# Patient Record
Sex: Male | Born: 1950 | Race: White | Hispanic: No | Marital: Married | Smoking: Never smoker
Health system: Southern US, Community
[De-identification: ages and names within clinical notes are randomized; demographics above are authoritative.]

## PROBLEM LIST (undated history)

## (undated) DIAGNOSIS — T7840XA Allergy, unspecified, initial encounter: Secondary | ICD-10-CM

## (undated) DIAGNOSIS — N529 Male erectile dysfunction, unspecified: Secondary | ICD-10-CM

## (undated) DIAGNOSIS — K219 Gastro-esophageal reflux disease without esophagitis: Secondary | ICD-10-CM

## (undated) DIAGNOSIS — L301 Dyshidrosis [pompholyx]: Secondary | ICD-10-CM

## (undated) DIAGNOSIS — R079 Chest pain, unspecified: Secondary | ICD-10-CM

## (undated) DIAGNOSIS — J309 Allergic rhinitis, unspecified: Secondary | ICD-10-CM

## (undated) DIAGNOSIS — J45901 Unspecified asthma with (acute) exacerbation: Secondary | ICD-10-CM

## (undated) DIAGNOSIS — S43109A Unspecified dislocation of unspecified acromioclavicular joint, initial encounter: Secondary | ICD-10-CM

## (undated) HISTORY — DX: Chest pain, unspecified: R07.9

## (undated) HISTORY — DX: Male erectile dysfunction, unspecified: N52.9

## (undated) HISTORY — DX: Gastro-esophageal reflux disease without esophagitis: K21.9

## (undated) HISTORY — DX: Dyshidrosis (pompholyx): L30.1

## (undated) HISTORY — DX: Unspecified dislocation of unspecified acromioclavicular joint, initial encounter: S43.109A

## (undated) HISTORY — PX: TONSILLECTOMY AND ADENOIDECTOMY: SUR1326

## (undated) HISTORY — DX: Allergy, unspecified, initial encounter: T78.40XA

## (undated) HISTORY — PX: EYE SURGERY: SHX253

## (undated) HISTORY — DX: Allergic rhinitis, unspecified: J30.9

## (undated) HISTORY — DX: Unspecified asthma with (acute) exacerbation: J45.901

---

## 1999-02-24 ENCOUNTER — Encounter: Payer: Self-pay | Admitting: Emergency Medicine

## 1999-02-24 ENCOUNTER — Emergency Department (HOSPITAL_COMMUNITY): Admission: EM | Admit: 1999-02-24 | Discharge: 1999-02-24 | Payer: Self-pay | Admitting: Emergency Medicine

## 2000-01-07 ENCOUNTER — Encounter: Payer: Self-pay | Admitting: Emergency Medicine

## 2000-01-07 ENCOUNTER — Emergency Department (HOSPITAL_COMMUNITY): Admission: EM | Admit: 2000-01-07 | Discharge: 2000-01-07 | Payer: Self-pay | Admitting: Emergency Medicine

## 2002-05-25 ENCOUNTER — Encounter: Admission: RE | Admit: 2002-05-25 | Discharge: 2002-05-25 | Payer: Self-pay | Admitting: Family Medicine

## 2002-05-25 ENCOUNTER — Encounter: Payer: Self-pay | Admitting: Family Medicine

## 2004-02-26 HISTORY — PX: COLONOSCOPY: SHX174

## 2004-09-13 ENCOUNTER — Ambulatory Visit: Payer: Self-pay | Admitting: Family Medicine

## 2004-10-01 ENCOUNTER — Ambulatory Visit: Payer: Self-pay | Admitting: Family Medicine

## 2004-10-18 ENCOUNTER — Ambulatory Visit: Payer: Self-pay | Admitting: Internal Medicine

## 2004-11-01 ENCOUNTER — Ambulatory Visit: Payer: Self-pay | Admitting: Internal Medicine

## 2005-11-18 ENCOUNTER — Ambulatory Visit (HOSPITAL_COMMUNITY): Admission: RE | Admit: 2005-11-18 | Discharge: 2005-11-18 | Payer: Self-pay | Admitting: Orthopedic Surgery

## 2005-11-18 ENCOUNTER — Emergency Department (HOSPITAL_COMMUNITY): Admission: EM | Admit: 2005-11-18 | Discharge: 2005-11-18 | Payer: Self-pay | Admitting: Family Medicine

## 2006-10-21 DIAGNOSIS — K219 Gastro-esophageal reflux disease without esophagitis: Secondary | ICD-10-CM

## 2006-10-21 HISTORY — DX: Gastro-esophageal reflux disease without esophagitis: K21.9

## 2007-11-30 DIAGNOSIS — S43109A Unspecified dislocation of unspecified acromioclavicular joint, initial encounter: Secondary | ICD-10-CM | POA: Insufficient documentation

## 2007-11-30 HISTORY — DX: Unspecified dislocation of unspecified acromioclavicular joint, initial encounter: S43.109A

## 2007-12-01 ENCOUNTER — Ambulatory Visit: Payer: Self-pay | Admitting: Family Medicine

## 2008-04-22 ENCOUNTER — Ambulatory Visit: Payer: Self-pay | Admitting: Family Medicine

## 2008-04-22 DIAGNOSIS — L301 Dyshidrosis [pompholyx]: Secondary | ICD-10-CM

## 2008-04-22 HISTORY — DX: Dyshidrosis (pompholyx): L30.1

## 2009-04-21 ENCOUNTER — Ambulatory Visit: Payer: Self-pay | Admitting: Family Medicine

## 2009-04-21 LAB — CONVERTED CEMR LAB
ALT: 25 units/L (ref 0–53)
AST: 20 units/L (ref 0–37)
Albumin: 4 g/dL (ref 3.5–5.2)
Alkaline Phosphatase: 57 units/L (ref 39–117)
BUN: 12 mg/dL (ref 6–23)
Basophils Absolute: 0 10*3/uL (ref 0.0–0.1)
Basophils Relative: 0.3 % (ref 0.0–3.0)
Bilirubin, Direct: 0.2 mg/dL (ref 0.0–0.3)
Blood in Urine, dipstick: NEGATIVE
CO2: 28 meq/L (ref 19–32)
Calcium: 9 mg/dL (ref 8.4–10.5)
Chloride: 109 meq/L (ref 96–112)
Cholesterol: 196 mg/dL (ref 0–200)
Creatinine, Ser: 0.9 mg/dL (ref 0.4–1.5)
Eosinophils Absolute: 0.1 10*3/uL (ref 0.0–0.7)
Eosinophils Relative: 3.7 % (ref 0.0–5.0)
GFR calc non Af Amer: 92.36 mL/min (ref >60)
Glucose, Bld: 96 mg/dL (ref 70–99)
Glucose, Urine, Semiquant: NEGATIVE
HCT: 43 % (ref 39.0–52.0)
HDL: 83 mg/dL (ref >39.00)
Hemoglobin: 14.2 g/dL (ref 13.0–17.0)
LDL Cholesterol: 107 mg/dL — ABNORMAL HIGH (ref 0–99)
Lymphocytes Relative: 17.5 % (ref 12.0–46.0)
Lymphs Abs: 0.6 10*3/uL — ABNORMAL LOW (ref 0.7–4.0)
MCHC: 33.1 g/dL (ref 30.0–36.0)
MCV: 98 fL (ref 78.0–100.0)
Monocytes Absolute: 0.4 10*3/uL (ref 0.1–1.0)
Monocytes Relative: 12.3 % — ABNORMAL HIGH (ref 3.0–12.0)
Neutro Abs: 2.2 10*3/uL (ref 1.4–7.7)
Neutrophils Relative %: 66.2 % (ref 43.0–77.0)
Nitrite: NEGATIVE
PSA: 0.9 ng/mL (ref 0.10–4.00)
Platelets: 264 10*3/uL (ref 150.0–400.0)
Potassium: 3.8 meq/L (ref 3.5–5.1)
RBC: 4.39 M/uL (ref 4.22–5.81)
RDW: 12.2 % (ref 11.5–14.6)
Sodium: 141 meq/L (ref 135–145)
Specific Gravity, Urine: 1.025
TSH: 1 microintl units/mL (ref 0.35–5.50)
Total Bilirubin: 0.9 mg/dL (ref 0.3–1.2)
Total CHOL/HDL Ratio: 2
Total Protein: 7 g/dL (ref 6.0–8.3)
Triglycerides: 31 mg/dL (ref 0.0–149.0)
Urobilinogen, UA: 0.2
VLDL: 6.2 mg/dL (ref 0.0–40.0)
WBC Urine, dipstick: NEGATIVE
WBC: 3.3 10*3/uL — ABNORMAL LOW (ref 4.5–10.5)
pH: 5

## 2009-04-27 ENCOUNTER — Ambulatory Visit: Payer: Self-pay | Admitting: Family Medicine

## 2009-04-27 DIAGNOSIS — J309 Allergic rhinitis, unspecified: Secondary | ICD-10-CM

## 2009-04-27 DIAGNOSIS — R079 Chest pain, unspecified: Secondary | ICD-10-CM

## 2009-04-27 DIAGNOSIS — N529 Male erectile dysfunction, unspecified: Secondary | ICD-10-CM

## 2009-04-27 HISTORY — DX: Male erectile dysfunction, unspecified: N52.9

## 2009-04-27 HISTORY — DX: Allergic rhinitis, unspecified: J30.9

## 2009-04-27 HISTORY — DX: Chest pain, unspecified: R07.9

## 2009-05-09 ENCOUNTER — Telehealth: Payer: Self-pay | Admitting: Family Medicine

## 2010-03-02 ENCOUNTER — Ambulatory Visit
Admission: RE | Admit: 2010-03-02 | Discharge: 2010-03-02 | Payer: Self-pay | Source: Home / Self Care | Attending: Internal Medicine | Admitting: Internal Medicine

## 2010-03-02 ENCOUNTER — Telehealth: Payer: Self-pay | Admitting: Family Medicine

## 2010-03-02 DIAGNOSIS — J45901 Unspecified asthma with (acute) exacerbation: Secondary | ICD-10-CM

## 2010-03-02 HISTORY — DX: Unspecified asthma with (acute) exacerbation: J45.901

## 2010-03-27 NOTE — Progress Notes (Signed)
Summary: name of Dentist?  Phone Note Call from Patient   Caller: Patient Call For: [name] MD Summary of Call: Pt needs name and phone number of Dentist that he was talking to Dr. Tawanna Cooler about on his last visit. Thinks he is located on [employer] [phone] Initial call taken by: Lynann Beaver CMA,  May 09, 2009 3:08 PM  Follow-up for Phone Call        Dr. Alvester Morin on [address] Follow-up by: [name] MD,  May 09, 2009 3:46 PM  Additional Follow-up for Phone Call Additional follow up Details #1::        Pt notified. Additional Follow-up by: Lynann Beaver CMA,  May 09, 2009 4:00 PM

## 2010-03-27 NOTE — Assessment & Plan Note (Signed)
Summary: cpx//ccm   Vital Signs:  Patient profile:   60 year old male Height:      70.5 inches Weight:      199 pounds BMI:     28.25 Temp:     98.5 degrees F oral BP sitting:   130 / 62  (left arm) Cuff size:   regular  Vitals Entered By: Kern Reap CMA Duncan Dull) (April 27, 2009 2:00 PM)  Reason for Visit cpx  History of Present Illness: Evan Parker is a 60 year old, married male, nonsmoker comes in for general physical evaluation because of underlying allergic rhinitis, occasional asthma, eczema, and erectile dysfunction.  The allergic rhinitis is treated with plain Zyrtec nightly  Two the occasional wheezing.  He takes Ventolin p.r.n.  He uses Lidex .05% cream p.r.n. for eczema of his hands and feet.  He takes Cialis 20 mg p.r.n. for ED.  He gets routine eye care.  Dental care.  Colonoscopy done, and GI normal.  Tetanus 2002, declines, seasonal flu shot  a new problem is chest pain.  He states he has intermittent chest pain that he describes as a sudden onset of severe sharp pain and he points to the mid sternum as the source of his discomfort.  He states it usually occurs at rest or at night.  He tends the E. late.  He does not smoke.  He does enjoy his crown royal also enjoys an occasional drink of vodka.  He states when he eats and drinks late best when his symptoms occur.  He has no exertional component.  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  With physical labor he has no chest pain.  He has no difficulty swallowing.  Allergies: No Known Drug Allergies  Past History:  Past medical, surgical, family and social histories (including risk factors) reviewed, and no changes noted (except as noted below).  Past Medical History: Reviewed history from 04/22/2008 and no changes required. GERD dyshidrotic eczema  Past Surgical History: Reviewed history from 10/21/2006 and no changes required. T/A FB R eye Colonoscopy  Family History: Reviewed  history from 10/21/2006 and no changes required. Family History Hypertension  Social History: Reviewed history from 10/21/2006 and no changes required. Occupation: Self-employed Married Never Smoked Alcohol use-no Drug use-no  Review of Systems      See HPI  Physical Exam  General:  Well-developed,well-nourished,in no acute distress; alert,appropriate and cooperative throughout examination Head:  Normocephalic and atraumatic without obvious abnormalities. No apparent alopecia or balding. Eyes:  No corneal or conjunctival inflammation noted. EOMI. Perrla. Funduscopic exam benign, without hemorrhages, exudates or papilledema. Vision grossly normal. Ears:  External ear exam shows no significant lesions or deformities.  Otoscopic examination reveals clear canals, tympanic membranes are intact bilaterally without bulging, retraction, inflammation or discharge. Hearing is grossly normal bilaterally. Nose:  External nasal examination shows no deformity or inflammation. Nasal mucosa are pink and moist without lesions or exudates. Mouth:  Oral mucosa and oropharynx without lesions or exudates.  Teeth in good repair. Neck:  No deformities, masses, or tenderness noted. Chest Wall:  No deformities, masses, tenderness or gynecomastia noted. Breasts:  No masses or gynecomastia noted Lungs:  Normal respiratory effort, chest expands symmetrically. Lungs are clear to auscultation, no crackles or wheezes. Heart:  Normal rate and regular rhythm. S1 and S2 normal without gallop, murmur, click, rub or other extra sounds. Abdomen:  Bowel sounds positive,abdomen soft and non-tender without masses, organomegaly or hernias noted. Rectal:  No external abnormalities noted. Normal sphincter tone. No rectal masses or tenderness. Genitalia:  Testes bilaterally descended without nodularity, tenderness or masses. No scrotal masses or lesions. No penis lesions or urethral discharge. Prostate:  Prostate gland firm and  smooth, no enlargement, nodularity, tenderness, mass, asymmetry or induration. Msk:  No deformity or scoliosis noted of thoracic or lumbar spine.   Pulses:  R and L carotid,radial,femoral,dorsalis pedis and posterior tibial pulses are full and equal bilaterally Extremities:  No clubbing, cyanosis, edema, or deformity noted with normal full range of motion of all joints.   Neurologic:  No cranial nerve deficits noted. Station and gait are normal. Plantar reflexes are down-going bilaterally. DTRs are symmetrical throughout. Sensory, motor and coordinative functions appear intact. Skin:  scarring left and right shoulder from previous sun damage.  Otherwise, total skin exam normal Cervical Nodes:  No lymphadenopathy noted Axillary Nodes:  No palpable lymphadenopathy Inguinal Nodes:  No significant adenopathy Psych:  Cognition and judgment appear intact. Alert and cooperative with normal attention span and concentration. 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  Uad  Orders: Prescription Created Electronically [phone])  Complete Medication List: 1)  Zyrtec Allergy 10 Mg Tabs (Cetirizine hcl) 2)  Ventolin Hfa 108 (90 Base) Mcg/act Aers (Albuterol sulfate) .Marland Kitchen.. 1 or 2 ps three times a day as needed 3)  Lidex 0.05 % Crea (Fluocinonide) .... Apply at bedtime 4)  Cialis 20 Mg Tabs (Tadalafil) .... Uad  Patient Instructions: 1)  continue your current medications. 2)  I think the severe, sharp pain, your having is related to esophageal spasm from reflux.  The treatment of which is to avoid alcohol caffeine, and peppermint, and take OTC Prilosec 20 mg before breakfast and 20 mg before evening meal.  If this does not resolve her symptoms or they get worse.  Please call for further evaluation remember nothing to eat or drink for two hours before you go to bed at night and elevate y  head on two pillows 3)  Avoid foods high in acid (tomatoes, citrus juices, spicy foods). Avoid eating within two hours of lying down or before exercising. Do not over eat; try smaller more frequent meals. Elevate head of bed twelve inches when sleeping. Prescriptions: VENTOLIN HFA 108 (90 BASE) MCG/ACT  AERS (ALBUTEROL SULFATE) 1 or 2 ps three times a day as needed  #1 x 1   Entered and Authorized by:   [name] MD   Signed by:   [name] MD on 04/27/2009   Method used:   Electronically to        [facility] (retail)  [address]       [place], [place]  [address]       Ph: [phone]       Fax: [phone]   RxID:   [id number] CIALIS 20 MG TABS (TADALAFIL) UAD  #6 x 11   Entered and Authorized by:   [name] MD   Signed by:   [name] MD on 04/27/2009   Method used:   Electronically to        [facility] (retail)       [address]       [place], [place]  [address]       Ph: [phone]       Fax: [phone]   RxID:   [id number] LIDEX 0.05 % CREA  (FLUOCINONIDE) apply at bedtime  #60 gr x 3   Entered and Authorized by:   [name] MD   Signed by:   [name] MD on 04/27/2009   Method used:   Electronically to        [facility] (retail)       [address]       [place], [place]  [address]       Ph: [phone]       Fax: [phone]   RxID:   [id number]

## 2010-03-29 NOTE — Progress Notes (Signed)
Summary: sinus infection  Phone Note Call from Patient Call back at Home Phone [phone]   Caller: Patient Call For: [name] MD Summary of Call: Pt is calling to see if he can get in to see Dr Tawanna Cooler today.  Has called several times and really does not want to see anyone else. Initial call taken by: Lynann Beaver CMA AAMA,  March 02, 2010 9:49 AM  Follow-up for Phone Call        Fleet Contras please call find out what the issue is............. I would be happy to see him Follow-up by: [name] MD,  March 02, 2010 12:17 PM  Additional Follow-up for Phone Call Additional follow up Details #1::        spoke with patient Additional Follow-up by: Kern Reap CMA Duncan Dull),  March 02, 2010 1:37 PM

## 2010-03-29 NOTE — Assessment & Plan Note (Signed)
Summary: sinus inf/cjr   Vital Signs:  Patient profile:   60 year old male Weight:      189 pounds BMI:     26.83 Temp:     98.4 degrees F oral BP sitting:   130 / 90  (left arm) Cuff size:   regular  Vitals Entered By: Kern Reap CMA Duncan Dull) (March 02, 2010 2:37 PM) CC: head and chest congestion Is Patient Diabetic? No   CC:  head and chest congestion.  History of Present Illness: Evan Parker is a 60 year old, married male, nonsmoker, who has a history of underlying allergic rhinitis, who is around cats and dust.  This past Monday and then the next day Tuesday developed a congestion, postnasal drip, and cough.  Allergies: No Known Drug Allergies  Past History:  Past medical, surgical, family and social histories (including risk factors) reviewed for relevance to current acute and chronic problems.  Past Medical History: Reviewed history from 04/22/2008 and no changes required. GERD dyshidrotic eczema  Past Surgical History: Reviewed history from 10/21/2006 and no changes required. T/A FB R eye Colonoscopy  Family History: Reviewed history from 10/21/2006 and no changes required. Family History Hypertension  Social History: Reviewed history from 10/21/2006 and no changes required. Occupation: Self-employed Married Never Smoked Alcohol use-no Drug use-no  Review of Systems      See HPI  Physical Exam  General:  Well-developed,well-nourished,in no acute distress; alert,appropriate and cooperative throughout examination Head:  Normocephalic and atraumatic without obvious abnormalities. No apparent alopecia or balding. Eyes:  No corneal or conjunctival inflammation noted. EOMI. Perrla. Funduscopic exam benign, without hemorrhages, exudates or papilledema. Vision grossly normal. Ears:  External ear exam shows no significant lesions or deformities.  Otoscopic examination reveals clear canals, tympanic membranes are intact bilaterally without bulging, retraction,  inflammation or discharge. Hearing is grossly normal bilaterally. Nose:  External nasal examination shows no deformity or inflammation. Nasal mucosa are pink and moist without lesions or exudates. Mouth:  Oral mucosa and oropharynx without lesions or exudates.  Teeth in good repair. Neck:  No deformities, masses, or tenderness noted. Chest Wall:  No deformities, masses, tenderness or gynecomastia noted. Lungs:  symmetrical breath sounds, mild late expiratory wheezing   Problems:  Medical Problems Added: 1)  Dx of Asthma, Acute  (YQM-578.46)  Impression & Recommendations:  Problem # 1:  ASTHMA, ACUTE (NGE-952.84) Assessment New  His updated medication list for this problem includes:    Ventolin Hfa 108 (90 Base) Mcg/act Aers (Albuterol sulfate) .Marland Kitchen... 1 or 2 ps three times a day as needed    Prednisone 20 Mg Tabs (Prednisone) ..... Uad  Complete Medication List: 1)  Zyrtec Allergy 10 Mg Tabs (Cetirizine hcl) 2)  Ventolin Hfa 108 (90 Base) Mcg/act Aers (Albuterol sulfate) .Marland Kitchen.. 1 or 2 ps three times a day as needed 3)  Lidex 0.05 % Crea (Fluocinonide) .... Apply at bedtime 4)  Cialis 20 Mg Tabs (Tadalafil) .... Uad 5)  Prednisone 20 Mg Tabs (Prednisone) .... Uad  Patient Instructions: 1)  begin prednisone two tabs now then starting tomorrow morning two tabs every morning x 3 days, one x 3 days, a half x 3 days, then half a tablet Monday, Wednesday, Friday, for a two week taper. 2)  Return p.r.n. Prescriptions: PREDNISONE 20 MG TABS (PREDNISONE) UAD  #40 x 1   Entered and Authorized by:   [name] MD   Signed by:   [name] MD on 03/02/2010   Method used:  Electronically to        [facility]* (retail)       [address] [place], [place]  [address]       Ph: [phone] or [phone]       Fax: [phone]   RxID:   [id number]    Orders Added: 1)  Est. Patient Level IV [34742]

## 2010-04-13 ENCOUNTER — Telehealth: Payer: Self-pay | Admitting: Family Medicine

## 2010-04-13 NOTE — Telephone Encounter (Signed)
Left message for patient to return call.

## 2010-04-13 NOTE — Telephone Encounter (Signed)
Triage vm----wants to know last date of tetanus shot? Has a deep splinter in his finger and is throbbing.

## 2010-04-13 NOTE — Telephone Encounter (Signed)
patient  Will come in Monday or Tuesday in the afternoon for a Tdap. Please schedule and give the patient a call.  thanks

## 2010-07-13 NOTE — Op Note (Signed)
NAME:  Evan Parker, Evan Parker                 ACCOUNT NO.:  [id number]   MEDICAL RECORD NO.:  [id number]          PATIENT TYPE:  AMB   LOCATION:  SDS                          FACILITY:  [facility]   PHYSICIAN:  Artist Pais. Weingold, M.D.DATE OF BIRTH:  01/12/1951   DATE OF PROCEDURE:  11/18/2005  DATE OF DISCHARGE:  11/18/2005                                 OPERATIVE REPORT   PREOPERATIVE DIAGNOSIS:  Imbed foreign material palmar aspect of right hand.   POSTOPERATIVE DIAGNOSIS:  Imbed foreign material palmar aspect of right  hand.   PROCEDURE:  1. Removal of foreign body, deep.  2. Irrigation, debridement and exploration of above.   SURGEON:  Artist Pais. Mina Marble, M.D.   ASSISTANT:  None.   ANESTHESIA:  General.   TOURNIQUET TIME:  Twenty-five minutes.   COMPLICATIONS:  None.   DRAINS:  None.   OPERATIVE REPORT:  The patient was taken to the operating room after the  induction of adequate general  anesthesia.  The right upper extremity was  prepped and draped in the usual sterile fashion.  Esmarch was used to  exsanguinate the limb, inflated to  250 mmHg.  At this point in time a large  a large wooden splinter, which measuring about a centimeter in diameter, 8-  10 cm length was carefully traced into the palmar aspect of the hand.  The  area where the splinter entered the hand was incised obliquely across the  palm for 4 cm until the entire splinter was removed.  Fragments of wood  removed along its path.  Dissection was carried down to the level of the  long ring finger flexor sheath.  The neurovascular bundles  were intact.  The flexor sheaths were intact.  It was irrigated thoroughly with a liter of  normal saline and was closed with 4-0 nylon.  A sterile dressing __________  was applied.  The patient tolerated the procedure well and went to the  recovery room in stable fashion.      Artist Pais Mina Marble, M.D.  Electronically Signed     MAW/MEDQ  D:  11/19/2005  T:  11/20/2005   Job:  [id number]

## 2010-07-13 NOTE — Consult Note (Signed)
NAME:  Evan Parker, Evan Parker                 ACCOUNT NO.:  [id number]   MEDICAL RECORD NO.:  [id number]          PATIENT TYPE:  AMB   LOCATION:  SDS                          FACILITY:  [facility]   PHYSICIAN:  Artist Pais. Mina Marble, M.D.DATE OF BIRTH:  25-Jan-1951   DATE OF CONSULTATION:  DATE OF DISCHARGE:                                   CONSULTATION   REFERRING PHYSICIAN:  Maurice March, M.D.   REASON FOR CONSULTATION:  Evan Parker is a 60 year old right-handed male who  presents today status post injury while working with a table saw with a  large wooden splinter embedded in the palmar aspect of his right hand with  decreased motion of the long and ring finger and intermittent numbness and  tingling.   ALLERGIES:  NO KNOWN DRUG ALLERGIES.   MEDICATIONS:  None.   REASON FOR HOSPITALIZATION:  Surgery.   PAST MEDICAL HISTORY:  Noncontributory.   SOCIAL HISTORY:  Noncontributory.   PHYSICAL EXAMINATION:  GENERAL:  Well-developed, well-nourished male,  pleasant and alert x3.  HAND:  He has an obvious large, wooden splinter, which is about 1 cm in  diameter, 8 cm long, that is piercing the palmar aspect in the area of the  long finger metacarpal phalangeal joint A1 pulley area going toward the  ulnar side.  He has intermittent numbness and tingling in the long ring  finger and loss of motion with pain.  X-rays show soft tissue shattering  only.  No fractures are noted.   ASSESSMENT:  A 60 year old male with a large, embedded, foreign body in the  palmar aspect of his dominant right hand.  He had several attempts in the  [facility], under local anesthesia, to have this removed.  I  recommended taking him to the operating room for removal above, with  expiration as necessary.      Artist Pais Mina Marble, M.D.  Electronically Signed     MAW/MEDQ  D:  11/19/2005  T:  11/20/2005  Job:  [id number]

## 2010-10-08 ENCOUNTER — Other Ambulatory Visit (INDEPENDENT_AMBULATORY_CARE_PROVIDER_SITE_OTHER): Payer: BC Managed Care – PPO

## 2010-10-08 DIAGNOSIS — Z0000 Encounter for general adult medical examination without abnormal findings: Secondary | ICD-10-CM

## 2010-10-08 LAB — LIPID PANEL
Cholesterol: 200 mg/dL (ref 0–200)
HDL: 76.1 mg/dL (ref >39.00)
LDL Cholesterol: 118 mg/dL — ABNORMAL HIGH (ref 0–99)
Total CHOL/HDL Ratio: 3
Triglycerides: 28 mg/dL (ref 0.0–149.0)
VLDL: 5.6 mg/dL (ref 0.0–40.0)

## 2010-10-08 LAB — BASIC METABOLIC PANEL
BUN: 17 mg/dL (ref 6–23)
CO2: 27 mEq/L (ref 19–32)
Calcium: 8.7 mg/dL (ref 8.4–10.5)
Chloride: 104 mEq/L (ref 96–112)
Creatinine, Ser: 0.9 mg/dL (ref 0.4–1.5)
GFR: 87.39 mL/min (ref >60.00)
Glucose, Bld: 109 mg/dL — ABNORMAL HIGH (ref 70–99)
Potassium: 4 mEq/L (ref 3.5–5.1)
Sodium: 139 mEq/L (ref 135–145)

## 2010-10-08 LAB — POCT URINALYSIS DIPSTICK
Ketones, UA: NEGATIVE
Leukocytes, UA: NEGATIVE
Protein, UA: NEGATIVE
Urobilinogen, UA: 0.2

## 2010-10-08 LAB — CBC WITH DIFFERENTIAL/PLATELET
Basophils Absolute: 0 10*3/uL (ref 0.0–0.1)
Basophils Relative: 0.8 % (ref 0.0–3.0)
Eosinophils Absolute: 0.2 10*3/uL (ref 0.0–0.7)
Eosinophils Relative: 4.1 % (ref 0.0–5.0)
HCT: 41 % (ref 39.0–52.0)
Hemoglobin: 14 g/dL (ref 13.0–17.0)
Lymphocytes Relative: 42.7 % (ref 12.0–46.0)
Lymphs Abs: 1.7 10*3/uL (ref 0.7–4.0)
MCHC: 34.3 g/dL (ref 30.0–36.0)
MCV: 96 fl (ref 78.0–100.0)
Monocytes Absolute: 0.3 10*3/uL (ref 0.1–1.0)
Monocytes Relative: 8.2 % (ref 3.0–12.0)
Neutro Abs: 1.8 10*3/uL (ref 1.4–7.7)
Neutrophils Relative %: 44.2 % (ref 43.0–77.0)
Platelets: 269 10*3/uL (ref 150.0–400.0)
RBC: 4.26 Mil/uL (ref 4.22–5.81)
RDW: 13 % (ref 11.5–14.6)
WBC: 4.1 10*3/uL — ABNORMAL LOW (ref 4.5–10.5)

## 2010-10-08 LAB — HEPATIC FUNCTION PANEL
Albumin: 4 g/dL (ref 3.5–5.2)
Alkaline Phosphatase: 55 U/L (ref 39–117)

## 2010-10-12 ENCOUNTER — Encounter: Payer: Self-pay | Admitting: Family Medicine

## 2010-10-15 ENCOUNTER — Encounter: Payer: Self-pay | Admitting: Family Medicine

## 2010-10-15 ENCOUNTER — Ambulatory Visit (INDEPENDENT_AMBULATORY_CARE_PROVIDER_SITE_OTHER): Payer: BC Managed Care – PPO | Admitting: Family Medicine

## 2010-10-15 DIAGNOSIS — J45901 Unspecified asthma with (acute) exacerbation: Secondary | ICD-10-CM

## 2010-10-15 DIAGNOSIS — Z0000 Encounter for general adult medical examination without abnormal findings: Secondary | ICD-10-CM

## 2010-10-15 DIAGNOSIS — J309 Allergic rhinitis, unspecified: Secondary | ICD-10-CM

## 2010-10-15 DIAGNOSIS — Z23 Encounter for immunization: Secondary | ICD-10-CM

## 2010-10-15 DIAGNOSIS — N529 Male erectile dysfunction, unspecified: Secondary | ICD-10-CM

## 2010-10-15 DIAGNOSIS — L301 Dyshidrosis [pompholyx]: Secondary | ICD-10-CM

## 2010-10-15 MED ORDER — FLUOCINONIDE 0.05 % EX CREA: TOPICAL_CREAM | Freq: Two times a day (BID) | CUTANEOUS | Status: DC

## 2010-10-15 MED ORDER — TADALAFIL 20 MG PO TABS: 20.0000 mg | ORAL_TABLET | Freq: Every day | ORAL | Status: DC | PRN

## 2010-10-15 NOTE — Patient Instructions (Signed)
Continue your current medications.  Return in one year or sooner if any problem

## 2010-10-15 NOTE — Progress Notes (Signed)
  Subjective:    Patient ID: Evan Parker, male    DOB: 1950-08-31, 60 y.o.   MRN: [id number]  HPI Evan Parker is a 60 year old, married male, nonsmoker, who comes in today for general physical examination because of history of allergic rhinitis, occasional asthma, eczema, erectile dysfunction.  He's always been in excellent health.  He said no chronic health problems except for above.  He uses Cialis 20 mg p.r.n. For ED and Lidex .05% p.r.n. For eczema.  He also takes over-the-counter Zyrtec 10 mg nightly for allergic rhinitis.  He has an albuterol inhaler home and over this series.  Not had to use it.  He gets routine eye care, dental care, colonoscopy, normal, tetanus booster today.   Review of Systems  Constitutional: Negative.   HENT: Negative.   Eyes: Negative.   Respiratory: Negative.   Cardiovascular: Negative.   Gastrointestinal: Negative.   Genitourinary: Negative.   Musculoskeletal: Negative.   Skin: Negative.   Neurological: Negative.   Hematological: Negative.   Psychiatric/Behavioral: Negative.        Objective:   Physical Exam  Constitutional: He is oriented to person, place, and time. He appears well-developed and well-nourished.  HENT:  Head: Normocephalic and atraumatic.  Right Ear: External ear normal.  Left Ear: External ear normal.  Nose: Nose normal.  Mouth/Throat: Oropharynx is clear and moist.  Eyes: Conjunctivae and EOM are normal. Pupils are equal, round, and reactive to light.  Neck: Normal range of motion. Neck supple. No JVD present. No tracheal deviation present. No thyromegaly present.  Cardiovascular: Normal rate, regular rhythm, normal heart sounds and intact distal pulses.  Exam reveals no gallop and no friction rub.   No murmur heard. Pulmonary/Chest: Effort normal and breath sounds normal. No stridor. No respiratory distress. He has no wheezes. He has no rales. He exhibits no tenderness.  Abdominal: Soft. Bowel sounds are normal. He exhibits no  distension and no mass. There is no tenderness. There is no rebound and no guarding.  Genitourinary: Rectum normal, prostate normal and penis normal. Guaiac negative stool. No penile tenderness.  Musculoskeletal: Normal range of motion. He exhibits no edema and no tenderness.  Lymphadenopathy:    He has no cervical adenopathy.  Neurological: He is alert and oriented to person, place, and time. He has normal reflexes. No cranial nerve deficit. He exhibits normal muscle tone.  Skin: Skin is warm and dry. No rash noted. No erythema. No pallor.       He has slight skin and light hair and light eyes, total body skin exam shows no abnormal.  Lesions  Psychiatric: He has a normal mood and affect. His behavior is normal. Judgment and thought content normal.          Assessment & Plan:  Healthy male.  Allergic rhinitis.  Continue Zyrtec 10 mg nightly  Eczema.  Continue Lidex p.r.n.  Erectile dysfunction continue Cialis 20 mg p.r.n.  Return one year or sooner if any problem

## 2010-12-25 ENCOUNTER — Telehealth: Payer: Self-pay | Admitting: Family Medicine

## 2010-12-25 NOTE — Telephone Encounter (Signed)
Pt cut finger on Saturday 10/27 and needs to know when he had last tetanus shot? Pls call.

## 2010-12-25 NOTE — Telephone Encounter (Signed)
Left message on machine for patient  Tdap given 8/12

## 2012-01-30 ENCOUNTER — Other Ambulatory Visit (INDEPENDENT_AMBULATORY_CARE_PROVIDER_SITE_OTHER): Payer: BC Managed Care – PPO

## 2012-01-30 DIAGNOSIS — Z0000 Encounter for general adult medical examination without abnormal findings: Secondary | ICD-10-CM

## 2012-01-30 LAB — BASIC METABOLIC PANEL
CO2: 29 mEq/L (ref 19–32)
Calcium: 9.2 mg/dL (ref 8.4–10.5)
Chloride: 103 mEq/L (ref 96–112)
Glucose, Bld: 99 mg/dL (ref 70–99)
Sodium: 139 mEq/L (ref 135–145)

## 2012-01-30 LAB — CBC WITH DIFFERENTIAL/PLATELET
Eosinophils Relative: 5.4 % — ABNORMAL HIGH (ref 0.0–5.0)
HCT: 43.3 % (ref 39.0–52.0)
Hemoglobin: 14.7 g/dL (ref 13.0–17.0)
Lymphocytes Relative: 46.4 % — ABNORMAL HIGH (ref 12.0–46.0)
Lymphs Abs: 2.2 10*3/uL (ref 0.7–4.0)
Monocytes Relative: 8.9 % (ref 3.0–12.0)
Platelets: 282 10*3/uL (ref 150.0–400.0)
WBC: 4.7 10*3/uL (ref 4.5–10.5)

## 2012-01-30 LAB — LIPID PANEL
HDL: 70.6 mg/dL (ref >39.00)
Total CHOL/HDL Ratio: 3
Triglycerides: 51 mg/dL (ref 0.0–149.0)
VLDL: 10.2 mg/dL (ref 0.0–40.0)

## 2012-01-30 LAB — POCT URINALYSIS DIPSTICK
Ketones, UA: NEGATIVE
Leukocytes, UA: NEGATIVE
Nitrite, UA: NEGATIVE
Protein, UA: NEGATIVE
Urobilinogen, UA: 0.2
pH, UA: 5.5

## 2012-01-30 LAB — PSA: PSA: 0.94 ng/mL (ref 0.10–4.00)

## 2012-01-30 LAB — HEPATIC FUNCTION PANEL
ALT: 28 U/L (ref 0–53)
Albumin: 3.9 g/dL (ref 3.5–5.2)
Total Protein: 6.9 g/dL (ref 6.0–8.3)

## 2012-01-30 LAB — LDL CHOLESTEROL, DIRECT: Direct LDL: 139.6 mg/dL

## 2012-02-05 ENCOUNTER — Encounter: Payer: Self-pay | Admitting: Family Medicine

## 2012-02-05 ENCOUNTER — Ambulatory Visit (INDEPENDENT_AMBULATORY_CARE_PROVIDER_SITE_OTHER): Payer: BC Managed Care – PPO | Admitting: Family Medicine

## 2012-02-05 VITALS — BP 130/80 | Temp 98.1°F | Ht 71.0 in | Wt 186.0 lb

## 2012-02-05 DIAGNOSIS — Z0000 Encounter for general adult medical examination without abnormal findings: Secondary | ICD-10-CM

## 2012-02-05 DIAGNOSIS — N529 Male erectile dysfunction, unspecified: Secondary | ICD-10-CM

## 2012-02-05 DIAGNOSIS — L301 Dyshidrosis [pompholyx]: Secondary | ICD-10-CM

## 2012-02-05 DIAGNOSIS — J309 Allergic rhinitis, unspecified: Secondary | ICD-10-CM

## 2012-02-05 DIAGNOSIS — Z23 Encounter for immunization: Secondary | ICD-10-CM

## 2012-02-05 NOTE — Progress Notes (Signed)
  Subjective:    Patient ID: Evan Parker, male    DOB: 12-31-1950, 61 y.o.   MRN: [id number]  HPI Evan Parker is a 55-year-old male married nonsmoker who comes in today for general physical examination  He's currently taking clindamycin 150 mg twice a day from his dentist because of a dental infection. 1 about the possibility of C. difficile colitis.  He takes Zyrtec 10 mg daily for allergic rhinitis, steroid cream when necessary for eczema and Cialis 20 mg when necessary for ED. He has an occasional episode of wheezing when he gets a bad cold and he keeps a canister of albuterol home to take when necessary  He gets routine eye care, dental care, colonoscopy normal, tetanus 2012, seasonal flu shot today, information given on shingles  Review of systems negative except he feels fatigued. 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 His sleep is normal weights normal.   Review of Systems  Constitutional: Negative.   HENT: Negative.   Eyes: Negative.   Respiratory: Negative.   Cardiovascular: Negative.   Gastrointestinal: Negative.   Genitourinary: Negative.   Musculoskeletal: Negative.   Skin: Negative.   Neurological: Negative.   Hematological: Negative.   Psychiatric/Behavioral: Negative.        Objective:   Physical Exam  Constitutional: He is oriented to person, place, and time. He appears well-developed and well-nourished.  HENT:  Head: Normocephalic and atraumatic.  Right Ear: External ear normal.  Left Ear: External ear normal.  Nose: Nose normal.  Mouth/Throat: Oropharynx is clear and moist.  Eyes: Conjunctivae normal and EOM are normal. Pupils are equal, round, and reactive to light.  Neck: Normal range of motion. Neck supple. No JVD present. No tracheal deviation present. No thyromegaly present.  Cardiovascular: Normal rate, regular rhythm, normal heart sounds and intact distal pulses.  Exam reveals no gallop and no friction rub.   No murmur  heard. Pulmonary/Chest: Effort normal and breath sounds normal. No stridor. No respiratory distress. He has no wheezes. He has no rales. He exhibits no tenderness.  Abdominal: Soft. Bowel sounds are normal. He exhibits no distension and no mass. There is no tenderness. There is no rebound and no guarding.  Genitourinary: Rectum normal, prostate normal and penis normal. Guaiac negative stool. No penile tenderness.  Musculoskeletal: Normal range of motion. He exhibits no edema and no tenderness.  Lymphadenopathy:    He has no cervical adenopathy.  Neurological: He is alert and oriented to person, place, and time. He has normal reflexes. No cranial nerve deficit. He exhibits normal muscle tone.  Skin: Skin is warm and dry. No rash noted. No erythema. No pallor.  Psychiatric: He has a normal mood and affect. His behavior is normal. Judgment and thought content normal.          Assessment & Plan:  Healthy male  Allergic rhinitis continue Zyrtec 10 mg each bedtime  Eczema Lidex when necessary  Erectile dysfunction Cialis 20 mg when necessary  Occasional asthma secondary to viral syndrome albuterol 1-2 puffs twice a day when necessary

## 2012-02-05 NOTE — Patient Instructions (Signed)
Continue your current medications  Return in one year sooner if any problems 

## 2012-09-06 ENCOUNTER — Encounter (HOSPITAL_COMMUNITY): Payer: Self-pay | Admitting: Family Medicine

## 2012-09-06 ENCOUNTER — Emergency Department (HOSPITAL_COMMUNITY): Payer: BC Managed Care – PPO

## 2012-09-06 ENCOUNTER — Emergency Department (HOSPITAL_COMMUNITY)
Admission: EM | Admit: 2012-09-06 | Discharge: 2012-09-07 | Disposition: A | Discharge status: Home / Self Care | Payer: BC Managed Care – PPO | Attending: Emergency Medicine | Admitting: Emergency Medicine

## 2012-09-06 DIAGNOSIS — R0602 Shortness of breath: Secondary | ICD-10-CM

## 2012-09-06 DIAGNOSIS — R51 Headache: Secondary | ICD-10-CM | POA: Insufficient documentation

## 2012-09-06 DIAGNOSIS — Z79899 Other long term (current) drug therapy: Secondary | ICD-10-CM | POA: Insufficient documentation

## 2012-09-06 DIAGNOSIS — Z8781 Personal history of (healed) traumatic fracture: Secondary | ICD-10-CM | POA: Insufficient documentation

## 2012-09-06 DIAGNOSIS — J45901 Unspecified asthma with (acute) exacerbation: Secondary | ICD-10-CM | POA: Insufficient documentation

## 2012-09-06 DIAGNOSIS — Z8719 Personal history of other diseases of the digestive system: Secondary | ICD-10-CM | POA: Insufficient documentation

## 2012-09-06 DIAGNOSIS — Z87448 Personal history of other diseases of urinary system: Secondary | ICD-10-CM | POA: Insufficient documentation

## 2012-09-06 DIAGNOSIS — R0789 Other chest pain: Secondary | ICD-10-CM | POA: Insufficient documentation

## 2012-09-06 DIAGNOSIS — Z8679 Personal history of other diseases of the circulatory system: Secondary | ICD-10-CM | POA: Insufficient documentation

## 2012-09-06 DIAGNOSIS — R5381 Other malaise: Secondary | ICD-10-CM | POA: Insufficient documentation

## 2012-09-06 DIAGNOSIS — R05 Cough: Secondary | ICD-10-CM | POA: Insufficient documentation

## 2012-09-06 DIAGNOSIS — R059 Cough, unspecified: Secondary | ICD-10-CM | POA: Insufficient documentation

## 2012-09-06 DIAGNOSIS — J4541 Moderate persistent asthma with (acute) exacerbation: Secondary | ICD-10-CM

## 2012-09-06 MED ORDER — LIDOCAINE-PRILOCAINE 2.5-2.5 % EX CREA
TOPICAL_CREAM | Freq: Once | CUTANEOUS | Status: AC
  Administered 2012-09-07: 1 via TOPICAL
  Filled 2012-09-06: qty 5

## 2012-09-06 MED ORDER — ALBUTEROL SULFATE (5 MG/ML) 0.5% IN NEBU
INHALATION_SOLUTION | RESPIRATORY_TRACT | Status: AC
  Administered 2012-09-06: 5 mg
  Filled 2012-09-06: qty 1

## 2012-09-06 MED ORDER — ALBUTEROL SULFATE (5 MG/ML) 0.5% IN NEBU
5.0000 mg | INHALATION_SOLUTION | RESPIRATORY_TRACT | Status: DC
  Administered 2012-09-07: 5 mg via RESPIRATORY_TRACT
  Filled 2012-09-06: qty 1

## 2012-09-06 MED ORDER — IPRATROPIUM BROMIDE 0.02 % IN SOLN
0.5000 mg | RESPIRATORY_TRACT | Status: DC
  Administered 2012-09-07: 0.5 mg via RESPIRATORY_TRACT
  Filled 2012-09-06: qty 2.5

## 2012-09-06 NOTE — ED Notes (Addendum)
Patient states that he has been having shortness of breath and difficulty breathing since yesterday. States that he has recent exposure to methanol race fuel. States having difficulty with taking a deep breath in. Reported shortness of breath with walking and ambulation. Feels like he can't get enough air into his lungs. Reports pain with deep inspiration. Expiratory and Inspiratory wheezing auscultated posterior upper lobes. Diminished in bases.

## 2012-09-06 NOTE — ED Provider Notes (Signed)
History    CSN: [id number] Arrival date & time 09/06/12  2156  First MD Initiated Contact with Patient 09/06/12 2305     Chief Complaint  Patient presents with  . Shortness of Breath   HPI Evan Parker is a very pleasant 62 y.o. male with a long-standing history of allergic rhinitis, environmental allergens, and asthma his entire life who presents with 3 weeks worsening shortness of breath. 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  Also complains of general malaise and headache. Shortness of breath started yesterday after drag-racing, it has persisted today, it has gotten worse, he's had a cough but he has not coughed anything up, occasional phlegm, no hemoptysis, no prior history of venous thromboembolic disease, some chest tightness but no overt chest pain-chest pain and tightness is present there with breathing. No fevers, no chills. No sick contacts. No nausea vomiting or diarrhea.  Patient also works as a Administrator.  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  Past Medical History  Diagnosis Date  . ALLERGIC RHINITIS 04/27/2009  . GERD 10/21/2006  . 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 CHEST PAIN 04/27/2009  . CLOSED DISLOCATION OF ACROMIOCLAVICULAR 11/30/2007  . ASTHMA, ACUTE 03/02/2010   Past Surgical History  Procedure Laterality Date  . Tonsillectomy and adenoidectomy    . Eye surgery      foreign body R eye   Family History  Problem Relation Age of Onset  . Hyperlipidemia Other    History  Substance Use Topics  . Smoking status: Never Smoker   .  Smokeless tobacco: Former Neurosurgeon    Types: Chew  . Alcohol Use: Yes     Comment: 1-2 beers    Review of Systems At least 10pt or greater review of systems completed and are negative except where specified in the HPI.  Allergies  Review of patient's allergies indicates no known allergies.  Home Medications   Current Outpatient Rx  Name  Route  Sig  Dispense  Refill  . albuterol (VENTOLIN HFA) 108 (90 BASE) MCG/ACT inhaler   Inhalation   Inhale 2 puffs into the lungs every 6 (six) hours as needed.           . cetirizine (ZYRTEC) 10 MG tablet   Oral   Take 10 mg by mouth daily.            BP 159/79  Pulse 72  Temp(Src) 98 F (36.7 C) (Oral)  Resp 22  SpO2 93% Physical Exam  Nursing notes reviewed.  Electronic medical record reviewed. VITAL SIGNS:   Filed Vitals:   09/06/12 2201 09/07/12 0017  BP: 159/79   Pulse: 72 68  Temp: 98 F (36.7 C)   TempSrc: Oral   Resp: 22 18  SpO2: 93% 97%   CONSTITUTIONAL: Awake, oriented, appears non-toxic HENT: Atraumatic, normocephalic, oral mucosa pink and moist, airway patent. Nares patent without drainage. External ears normal. EYES: Conjunctiva clear, EOMI, PERRLA NECK: Trachea midline, non-tender, supple CARDIOVASCULAR: Normal heart rate, Normal rhythm, No murmurs, rubs, gallops PULMONARY/CHEST:  Decreased breath sounds bilaterally with bilateral  wheezing and extended expiratory phase. Symmetrical breath sounds. Non-tender. ABDOMINAL: Non-distended, soft, non-tender - no rebound or guarding.  BS normal. NEUROLOGIC: Non-focal, moving all four extremities, no gross sensory or motor deficits. EXTREMITIES: No clubbing, cyanosis, or edema SKIN: Warm, Dry, No erythema, No rash  ED Course  Procedures (including critical care time)  Date: 09/07/2012  Rate: 65  Rhythm: normal sinus rhythm  QRS Axis: normal  Intervals: normal  ST/T Wave abnormalities: Slight J-point elevation in lead V2  Conduction Disutrbances: none   Narrative Interpretation: Small amount of J-point elevation in lead V2, not seen on prior, no significant consecutive ST elevation or depression consistent with acute ischemia or infarction  Labs Reviewed  BLOOD GAS, ARTERIAL - Abnormal; Notable for the following:    Bicarbonate 25.3 (*)    All other components within normal limits  CBC WITH DIFFERENTIAL - Abnormal; Notable for the following:    Eosinophils Relative 7 (*)    All other components within normal limits  BASIC METABOLIC PANEL - Abnormal; Notable for the following:    Potassium 3.4 (*)    Glucose, Bld 143 (*)    All other components within normal limits  CARBOXYHEMOGLOBIN   Dg Chest 2 View  09/07/2012   *RADIOLOGY REPORT*  Clinical Data: Shortness of breath; history of asthma.  CHEST - 2 VIEW  Comparison: None.  Findings: The lungs are well-aerated.  Peribronchial thickening may reflect the patient's asthma.  Mildly increased central lung markings are seen.  There is no evidence of focal opacification, pleural effusion or pneumothorax.  The heart is normal in size; the mediastinal contour is within normal limits.  No acute osseous abnormalities are seen.  IMPRESSION: Nonspecific mildly increased central lung markings noted; peribronchial thickening may reflect the patient's asthma. Findings could reflect a mild infectious process, or could be chronic in nature.   Original Report Authenticated By: Tonia Ghent, M.D.   1. Asthma exacerbation, moderate persistent   2. Shortness of breath     MDM  ODAI WIMMER is a 62 y.o. male presents with SOB - h/o of asthma.  Wheezing and decreased BS B/L - CXR shows what I feel is likely chronic asthma, no infiltrate seen on my or radiologist interpretation.  CO normal - concern with recent exposure to partial combusted methanol.  Pt able to ambulate without significant desaturation.  His albuterol inhaler was also likely out of date.  Suspect patient may need long-acting beta agonist and  inhaled steroid.  He is taking zyrtec - encouraged daily use.  Doubt ACS, no CP.  Pt low risk for PE (Well's 0) especially given clinical context.  He is non-toxic, completing full sentences, in no respiratory distress.  I do not think this patient requires inpatient management for his asthma exacerbation - I do suspect particulate matter plays a role in his asthma exacerbation - pt will delay racing again.  He will follow up with PCP for consideration for other asthmatic management.  DC with albuterol inhaler and spacer with instructions for use from RT.  Prednisone burst for inflammation.  Doubt he needs antibiotics - no evidence for infection.    I explained the diagnosis and have given explicit precautions to return to the ER including worsening SOB, chest pain or any other new or worsening symptoms. The patient and his wife understand and accept the medical plan as it's been dictated and I have answered their questions. Discharge instructions concerning home care and prescriptions have been given including albuterol and  prednisone.  The patient is STABLE and is discharged to home in good condition.    Jones Skene, MD 09/07/12 1205

## 2012-09-07 LAB — CARBOXYHEMOGLOBIN
Carboxyhemoglobin: 1.4 % (ref 0.5–1.5)
Methemoglobin: 1.2 % (ref 0.0–1.5)
O2 Saturation: 95.9 %
Total hemoglobin: 15.5 g/dL (ref 13.5–18.0)

## 2012-09-07 LAB — BLOOD GAS, ARTERIAL
Bicarbonate: 25.3 mEq/L — ABNORMAL HIGH (ref 20.0–24.0)
TCO2: 21.9 mmol/L (ref 0–100)
pCO2 arterial: 41.8 mmHg (ref 35.0–45.0)
pH, Arterial: 7.398 (ref 7.350–7.450)
pO2, Arterial: 81.5 mmHg (ref 80.0–100.0)

## 2012-09-07 LAB — CBC WITH DIFFERENTIAL/PLATELET
Basophils Absolute: 0 10*3/uL (ref 0.0–0.1)
Basophils Relative: 0 % (ref 0–1)
Eosinophils Absolute: 0.6 10*3/uL (ref 0.0–0.7)
Hemoglobin: 15.3 g/dL (ref 13.0–17.0)
MCH: 32.4 pg (ref 26.0–34.0)
MCHC: 35.6 g/dL (ref 30.0–36.0)
Monocytes Relative: 9 % (ref 3–12)
Neutrophils Relative %: 50 % (ref 43–77)
Platelets: 265 10*3/uL (ref 150–400)
RDW: 12.3 % (ref 11.5–15.5)

## 2012-09-07 LAB — BASIC METABOLIC PANEL
BUN: 14 mg/dL (ref 6–23)
GFR calc Af Amer: >90 mL/min (ref >90)
GFR calc non Af Amer: >90 mL/min (ref >90)
Potassium: 3.4 mEq/L — ABNORMAL LOW (ref 3.5–5.1)

## 2012-09-07 MED ORDER — PREDNISONE 20 MG PO TABS: 40.0000 mg | ORAL_TABLET | Freq: Every day | ORAL | Status: DC

## 2012-09-07 MED ORDER — AEROCHAMBER Z-STAT PLUS/MEDIUM MISC: 1.0000 | Freq: Once | Status: DC

## 2012-09-07 MED ORDER — ALBUTEROL SULFATE HFA 108 (90 BASE) MCG/ACT IN AERS
2.0000 | INHALATION_SPRAY | RESPIRATORY_TRACT | Status: DC | PRN
  Administered 2012-09-07: 2 via RESPIRATORY_TRACT
  Filled 2012-09-07 (×2): qty 6.7

## 2012-09-07 MED ORDER — PREDNISONE 20 MG PO TABS
40.0000 mg | ORAL_TABLET | Freq: Once | ORAL | Status: AC
  Administered 2012-09-07: 40 mg via ORAL
  Filled 2012-09-07: qty 2

## 2012-09-07 NOTE — ED Notes (Addendum)
Patient 02 maintained at 94 while walking. Patient denies any SOB while walking

## 2012-12-07 ENCOUNTER — Ambulatory Visit (INDEPENDENT_AMBULATORY_CARE_PROVIDER_SITE_OTHER): Payer: BC Managed Care – PPO | Admitting: Family Medicine

## 2012-12-07 ENCOUNTER — Encounter: Payer: Self-pay | Admitting: Family Medicine

## 2012-12-07 VITALS — BP 120/80 | Temp 98.1°F | Wt 186.0 lb

## 2012-12-07 DIAGNOSIS — J45909 Unspecified asthma, uncomplicated: Secondary | ICD-10-CM | POA: Insufficient documentation

## 2012-12-07 DIAGNOSIS — N529 Male erectile dysfunction, unspecified: Secondary | ICD-10-CM

## 2012-12-07 MED ORDER — TADALAFIL 20 MG PO TABS: 20.0000 mg | ORAL_TABLET | Freq: Every day | ORAL | Status: DC | PRN

## 2012-12-07 MED ORDER — PREDNISONE 20 MG PO TABS: ORAL_TABLET | ORAL | Status: DC

## 2012-12-07 MED ORDER — ALBUTEROL SULFATE HFA 108 (90 BASE) MCG/ACT IN AERS: 2.0000 | INHALATION_SPRAY | Freq: Four times a day (QID) | RESPIRATORY_TRACT | Status: DC | PRN

## 2012-12-07 NOTE — Patient Instructions (Signed)
Take the prednisone as directed  Albuterol 1 puff when necessary  Cialis 20 mg.............. [email or web address]  Purchased a mouth guard at [employer]

## 2012-12-07 NOTE — Progress Notes (Signed)
  Subjective:    Patient ID: Evan Parker, male    DOB: [birth year], 62 y.o.   MRN: [id number]  HPI Evan Parker is a 62 year old male married nonsmoker who comes in today for evaluation of 3 problems  He was recently seen in the hospital July 13 for evaluation of wheezing. He was given an albuterol inhaler to take when necessary. 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 This seems to have exacerbated his asthma. He typically uses one or 2 shots of albuterol weekly  He needs a refill on his Cialis  He's also complaining of pain in the right side of his jaw. 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

## 2012-12-30 ENCOUNTER — Telehealth: Payer: Self-pay | Admitting: Family Medicine

## 2012-12-30 NOTE — Telephone Encounter (Signed)
Pt has injured his bicep muscle. Its very painfull and is begiining to swell. Pt thinks he heard something "pop" in there or tore.  pt will be working in [employer]. Would like to know what to do?

## 2012-12-31 ENCOUNTER — Other Ambulatory Visit: Payer: Self-pay

## 2012-12-31 NOTE — Telephone Encounter (Signed)
Per Dr Tawanna Cooler patient should go to ortho.  Dr Cleophas Dunker

## 2013-01-01 NOTE — Telephone Encounter (Signed)
Pt aware and gave pt [facility] info

## 2013-01-25 HISTORY — PX: SHOULDER SURGERY: SHX246

## 2013-03-09 ENCOUNTER — Telehealth: Payer: Self-pay | Admitting: Family Medicine

## 2013-03-09 NOTE — Telephone Encounter (Signed)
I received a fax from [place] denying Cialis.  Pt must try and fail Viagra.

## 2013-03-10 NOTE — Telephone Encounter (Signed)
I have sent a message to the patient via MyChart

## 2013-03-10 NOTE — Telephone Encounter (Signed)
Spoke with patient and he does not need a prescription at this time

## 2013-05-31 ENCOUNTER — Other Ambulatory Visit (INDEPENDENT_AMBULATORY_CARE_PROVIDER_SITE_OTHER): Payer: BC Managed Care – PPO

## 2013-05-31 DIAGNOSIS — Z0000 Encounter for general adult medical examination without abnormal findings: Secondary | ICD-10-CM

## 2013-05-31 LAB — CBC WITH DIFFERENTIAL/PLATELET
BASOS PCT: 1 % (ref 0.0–3.0)
Basophils Absolute: 0 10*3/uL (ref 0.0–0.1)
EOS ABS: 0.3 10*3/uL (ref 0.0–0.7)
EOS PCT: 5.8 % — AB (ref 0.0–5.0)
HEMATOCRIT: 43.6 % (ref 39.0–52.0)
Hemoglobin: 14.9 g/dL (ref 13.0–17.0)
LYMPHS ABS: 1.9 10*3/uL (ref 0.7–4.0)
Lymphocytes Relative: 39.9 % (ref 12.0–46.0)
MCHC: 34.2 g/dL (ref 30.0–36.0)
MCV: 93.1 fl (ref 78.0–100.0)
MONO ABS: 0.4 10*3/uL (ref 0.1–1.0)
Monocytes Relative: 8.7 % (ref 3.0–12.0)
NEUTROS PCT: 44.6 % (ref 43.0–77.0)
Neutro Abs: 2.1 10*3/uL (ref 1.4–7.7)
Platelets: 287 10*3/uL (ref 150.0–400.0)
RBC: 4.69 Mil/uL (ref 4.22–5.81)
RDW: 13 % (ref 11.5–14.6)
WBC: 4.7 10*3/uL (ref 4.5–10.5)

## 2013-05-31 LAB — HEPATIC FUNCTION PANEL
ALK PHOS: 60 U/L (ref 39–117)
ALT: 25 U/L (ref 0–53)
AST: 21 U/L (ref 0–37)
Albumin: 4.2 g/dL (ref 3.5–5.2)
BILIRUBIN DIRECT: 0.1 mg/dL (ref 0.0–0.3)
TOTAL PROTEIN: 7.1 g/dL (ref 6.0–8.3)
Total Bilirubin: 0.8 mg/dL (ref 0.3–1.2)

## 2013-05-31 LAB — POCT URINALYSIS DIPSTICK
BILIRUBIN UA: NEGATIVE
Blood, UA: NEGATIVE
GLUCOSE UA: NEGATIVE
KETONES UA: NEGATIVE
LEUKOCYTES UA: NEGATIVE
Nitrite, UA: NEGATIVE
PROTEIN UA: NEGATIVE
SPEC GRAV UA: 1.025
Urobilinogen, UA: 0.2
pH, UA: 5.5

## 2013-05-31 LAB — BASIC METABOLIC PANEL
BUN: 15 mg/dL (ref 6–23)
CALCIUM: 9.2 mg/dL (ref 8.4–10.5)
CO2: 29 mEq/L (ref 19–32)
CREATININE: 0.8 mg/dL (ref 0.4–1.5)
Chloride: 105 mEq/L (ref 96–112)
GFR: 98.62 mL/min (ref >60.00)
GLUCOSE: 98 mg/dL (ref 70–99)
POTASSIUM: 4.3 meq/L (ref 3.5–5.1)
Sodium: 139 mEq/L (ref 135–145)

## 2013-05-31 LAB — LIPID PANEL
CHOLESTEROL: 211 mg/dL — AB (ref 0–200)
HDL: 78.3 mg/dL (ref >39.00)
LDL Cholesterol: 127 mg/dL — ABNORMAL HIGH (ref 0–99)
Total CHOL/HDL Ratio: 3
Triglycerides: 31 mg/dL (ref 0.0–149.0)
VLDL: 6.2 mg/dL (ref 0.0–40.0)

## 2013-05-31 LAB — TSH: TSH: 1.44 u[IU]/mL (ref 0.35–5.50)

## 2013-05-31 LAB — PSA: PSA: 0.87 ng/mL (ref 0.10–4.00)

## 2013-06-07 ENCOUNTER — Encounter: Payer: Self-pay | Admitting: Family Medicine

## 2013-06-07 ENCOUNTER — Ambulatory Visit (INDEPENDENT_AMBULATORY_CARE_PROVIDER_SITE_OTHER): Payer: BC Managed Care – PPO | Admitting: Family Medicine

## 2013-06-07 VITALS — BP 120/80 | Temp 98.6°F | Ht 71.0 in | Wt 190.0 lb

## 2013-06-07 DIAGNOSIS — J309 Allergic rhinitis, unspecified: Secondary | ICD-10-CM

## 2013-06-07 DIAGNOSIS — J45909 Unspecified asthma, uncomplicated: Secondary | ICD-10-CM

## 2013-06-07 DIAGNOSIS — H01139 Eczematous dermatitis of unspecified eye, unspecified eyelid: Secondary | ICD-10-CM

## 2013-06-07 DIAGNOSIS — S43109A Unspecified dislocation of unspecified acromioclavicular joint, initial encounter: Secondary | ICD-10-CM

## 2013-06-07 DIAGNOSIS — N529 Male erectile dysfunction, unspecified: Secondary | ICD-10-CM

## 2013-06-07 MED ORDER — ALBUTEROL SULFATE HFA 108 (90 BASE) MCG/ACT IN AERS
2.0000 | INHALATION_SPRAY | Freq: Four times a day (QID) | RESPIRATORY_TRACT | Status: DC | PRN
Start: 2013-06-07 — End: 2016-05-21

## 2013-06-07 MED ORDER — TRIAMCINOLONE ACETONIDE 0.025 % EX OINT: 1.0000 "application " | TOPICAL_OINTMENT | Freq: Two times a day (BID) | CUTANEOUS | Status: DC

## 2013-06-07 MED ORDER — TADALAFIL 20 MG PO TABS: 20.0000 mg | ORAL_TABLET | Freq: Every day | ORAL | Status: DC | PRN

## 2013-06-07 NOTE — Patient Instructions (Signed)
Continue good health habits  Call Dr. Hillis Range for an eye evaluation because of this a dense cataracts  Return in one year for general physical examination sooner if any problems  Dr. Rodena Goldmann

## 2013-06-07 NOTE — Progress Notes (Signed)
Pre visit review using our clinic review tool, if applicable. No additional management support is needed unless otherwise documented below in the visit note. 

## 2013-06-07 NOTE — Progress Notes (Signed)
   Subjective:    Patient ID: Evan Parker, male    DOB: 04/14/1950, 63 y.o.   MRN: [id number]  HPI Evan Parker is a 63 year old male married nonsmoker who comes in today for general physical examination  He is allergic rhinitis for which she takes Zyrtec plain  He uses Cialis 20 mg when necessary for ED  He takes albuterol when necessary for wheezing  His surgery and his right shoulder couple months ago by Dr. Durward Fortes. It went well he cannot lift anything more than 20 pounds going forward.  His last eye check by the optometrist so dense cataracts. He states he's having difficulty seeing at night and has halos around lights and has trouble with distance vision.Marland Kitchen He gets routine dental care followup colonoscopy and GI vaccinations up-to-date   Review of Systems  Constitutional: Negative.   HENT: Negative.   Eyes: Negative.   Respiratory: Negative.   Cardiovascular: Negative.   Gastrointestinal: Negative.   Genitourinary: Negative.   Musculoskeletal: Negative.   Skin: Negative.   Neurological: Negative.   Psychiatric/Behavioral: Negative.        Objective:   Physical Exam  Nursing note and vitals reviewed. Constitutional: He is oriented to person, place, and time. He appears well-developed and well-nourished.  HENT:  Head: Normocephalic and atraumatic.  Right Ear: External ear normal.  Left Ear: External ear normal.  Nose: Nose normal.  Mouth/Throat: Oropharynx is clear and moist.  Eyes: Conjunctivae and EOM are normal. Pupils are equal, round, and reactive to light.  Neck: Normal range of motion. Neck supple. No JVD present. No tracheal deviation present. No thyromegaly present.  Cardiovascular: Normal rate, regular rhythm, normal heart sounds and intact distal pulses.  Exam reveals no gallop and no friction rub.   No murmur heard. Pulmonary/Chest: Effort normal and breath sounds normal. No stridor. No respiratory distress. He has no wheezes. He has no rales. He exhibits no  tenderness.  Abdominal: Soft. Bowel sounds are normal. He exhibits no distension and no mass. There is no tenderness. There is no rebound and no guarding.  Genitourinary: Rectum normal, prostate normal and penis normal. Guaiac negative stool. No penile tenderness.  Musculoskeletal: Normal range of motion. He exhibits no edema and no tenderness.  Lymphadenopathy:    He has no cervical adenopathy.  Neurological: He is alert and oriented to person, place, and time. He has normal reflexes. No cranial nerve deficit. He exhibits normal muscle tone.  Skin: Skin is warm and dry. No rash noted. No erythema. No pallor.  Total body skin exam normal except for cough scar right anterior shoulder from previous surgery  Psychiatric: He has a normal mood and affect. His behavior is normal. Judgment and thought content normal.   Dense bilateral cataracts       Assessment & Plan:  Healthy male  Allergic rhinitis continue Zyrtec  Erectile dysfunction continue Cialis  Occasional asthma albuterol when necessary  Dense bilateral cataracts refer to Dr. Bing Plume

## 2013-11-22 ENCOUNTER — Ambulatory Visit (INDEPENDENT_AMBULATORY_CARE_PROVIDER_SITE_OTHER): Payer: BC Managed Care – PPO | Admitting: Family Medicine

## 2013-11-22 ENCOUNTER — Ambulatory Visit (INDEPENDENT_AMBULATORY_CARE_PROVIDER_SITE_OTHER)
Admission: RE | Admit: 2013-11-22 | Discharge: 2013-11-22 | Disposition: A | Discharge status: Home / Self Care | Payer: BC Managed Care – PPO | Source: Ambulatory Visit | Attending: Family Medicine | Admitting: Family Medicine

## 2013-11-22 ENCOUNTER — Encounter: Payer: Self-pay | Admitting: Family Medicine

## 2013-11-22 VITALS — BP 110/80 | Temp 98.0°F | Wt 184.0 lb

## 2013-11-22 DIAGNOSIS — Z23 Encounter for immunization: Secondary | ICD-10-CM

## 2013-11-22 DIAGNOSIS — M25551 Pain in right hip: Secondary | ICD-10-CM

## 2013-11-22 DIAGNOSIS — M25559 Pain in unspecified hip: Secondary | ICD-10-CM

## 2013-11-22 NOTE — Progress Notes (Signed)
   Subjective:    Patient ID: Evan Parker, male    DOB: 10-01-1950, 63 y.o.   MRN: [id number]  HPI Evan Parker is a 63 year old married male nonsmoker who comes in today with a four-month history of right hip pain without trauma  He's not in the last couple months she's had pain in his right hip. He describes as sometimes sharp sometimes dull. It comes and goes with certain positions were makes it hurt worse. No history of trauma.  His 43 year old son died this summer of a narcotic overdose. He still grieving. Advised to go to counseling   Review of Systems    review of systems negative Objective:   Physical Exam  Well-developed well-nourished male in no acute distress vital signs stable he is afebrile   in the supine position the legs were of equal length except his right leg is a quarter-inch shorter than the left which is not clinically significant. Sensation strength reflexes pulses are within normal limits. External range of motion limited to 30 bilaterally      Assessment & Plan:  Pain right hip......... x-ray........Marland Kitchen begin Motrin 400 mg twice a day 6.

## 2013-11-22 NOTE — Progress Notes (Signed)
Pre visit review using our clinic review tool, if applicable. No additional management support is needed unless otherwise documented below in the visit note. 

## 2013-11-22 NOTE — Patient Instructions (Signed)
Begin Motrin 400 mg twice daily with food  Go to the main office now for your x-ray  We will call you the report

## 2014-09-15 ENCOUNTER — Encounter: Payer: Self-pay | Admitting: Internal Medicine

## 2016-02-14 ENCOUNTER — Telehealth: Payer: Self-pay | Admitting: Family Medicine

## 2016-02-14 NOTE — Telephone Encounter (Signed)
Patient Name: Evan Parker  DOB: 18-May-1950    Initial Comment Caller states he is wanting to go over symptoms mold illness.    Nurse Assessment  Nurse: Thad Ranger RN, Denise Date/Time (Eastern Time): 02/14/2016 2:25:28 PM  Confirm and document reason for call. If symptomatic, describe symptoms. ---Pt has dizziness, chills/hot flashes w/occas night sweats, muscle aches, abd cramping/bloating, HA, sinus congestion. Wants to know if this is r/t mold exposure. Today he is having abd cramping and a severe HA.  Does the patient have any new or worsening symptoms? ---Yes  Will a triage be completed? ---Yes  Related visit to physician within the last 2 weeks? ---No  Does the PT have any chronic conditions? (i.e. diabetes, asthma, etc.) ---No  Is this a behavioral health or substance abuse call? ---No     Guidelines    Guideline Title Affirmed Question Affirmed Notes  Headache [1] Numbness of the face, arm or leg on one side of the body AND [2] new onset    Final Disposition User   Call EMS 911 Now Carmon, RN, Langley Gauss    Comments  Pt refused to call 911 or be seen in ER. States he wants to be seen at the [facility] for testing for mold exposure. States he has not been seen at the [facility] in 2 yrs and was last seen by Dr Sherren Mocha who is his PCP.  Called the [facility] BL w/pt report given. Questioned the pt not being seen at the [facility] in 2 years and was inst that he is still considered a current pt. Operator placed me on hold for the nurse.  Returned call to the [facility] due to lengthly wait time for the office nurse. Per Sharyn Lull, office nurse, authorization received to downgrade pt from 911 to be seen at [facility] within 24 hrs and RN may make the appt for the pt to be seen tomorrow in the [facility].  Advised pt of downgraded from 911 to be seen in [facility] within 24 hours and appt made in the [facility] for 02/15/16 at 1400 with Dr Colin Benton. Pt aware/agreeable.   Referrals  GO TO FACILITY REFUSED   Disagree/Comply: Disagree  Disagree/Comply  Reason: Disagree with instructions

## 2016-02-14 NOTE — Telephone Encounter (Signed)
Noted  

## 2016-02-15 ENCOUNTER — Encounter: Payer: Self-pay | Admitting: Family Medicine

## 2016-02-15 ENCOUNTER — Ambulatory Visit (INDEPENDENT_AMBULATORY_CARE_PROVIDER_SITE_OTHER): Payer: PRIVATE HEALTH INSURANCE | Admitting: Family Medicine

## 2016-02-15 VITALS — BP 120/88 | HR 51 | Temp 97.8°F | Ht 71.0 in | Wt 189.9 lb

## 2016-02-15 DIAGNOSIS — J989 Respiratory disorder, unspecified: Secondary | ICD-10-CM

## 2016-02-15 NOTE — Progress Notes (Signed)
Pre visit review using our clinic review tool, if applicable. No additional management support is needed unless otherwise documented below in the visit note. 

## 2016-02-15 NOTE — Progress Notes (Signed)
HPI:  Evan Parker is a pleasant 65 year old here for an acute visit for flu like symptoms. He reports that 3 days ago, right after cleaning some mold off miniblinds he developed a runny nose, congestion, cough, nausea, mild dizziness, headache and sore neck and back muscles. He thinks he may have had a mild fever. Most of these symptoms have now resolved. Currently he has no shortness of breath, thick mucus production, fevers, dizziness, wheezing or any significant symptoms other than some mild nasal congestion which she says is chronic with his allergies. He does have a history of asthma, but is not having any asthma symptoms at this time. He wonders if this was due to the mold and is inquiring about a blood tests for mold.  ROS: See pertinent positives and negatives per HPI.  Past Medical History:  Diagnosis Date  . ALLERGIC RHINITIS 04/27/2009  . ASTHMA, ACUTE 03/02/2010  . CHEST PAIN 04/27/2009  . CLOSED DISLOCATION OF ACROMIOCLAVICULAR 11/30/2007  . 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 GERD 10/21/2006    Past Surgical History:  Procedure Laterality Date  . EYE SURGERY     foreign body R eye  . TONSILLECTOMY AND ADENOIDECTOMY      Family History  Problem Relation Age of Onset  . Hyperlipidemia Other     Social History   Social History  . Marital status: Married    Spouse name: N/A  . Number of children: N/A  . Years of education: N/A   Social History Main Topics  . Smoking status: Never Smoker  . Smokeless tobacco: Former Systems developer    Types: Chew  . Alcohol use Yes     Comment: 1-2 beers  . Drug use: Unknown  . Sexual activity: Yes   Other Topics Concern  . None   Social History Narrative  . None     Current Outpatient Prescriptions:  .  albuterol (VENTOLIN HFA) 108 (90 BASE) MCG/ACT inhaler, Inhale 2 puffs into the lungs every 6 (six) hours as needed., Disp: 1 Inhaler, Rfl: 2 .  cetirizine (ZYRTEC) 10 MG tablet, Take 10 mg by  mouth daily.  , Disp: , Rfl:  .  tadalafil (CIALIS) 20 MG tablet, Take 1 tablet (20 mg total) by mouth daily as needed., Disp: 10 tablet, Rfl: 11 .  triamcinolone (KENALOG) 0.025 % ointment, Apply 1 application topically 2 (two) times daily., Disp: 30 g, Rfl: 2 .  triamcinolone cream (KENALOG) 0.1 %, Apply 1 application topically 2 (two) times daily., Disp: , Rfl:   EXAM:  Vitals:   02/15/16 1408  BP: 120/88  Pulse: (!) 51  Temp: 97.8 F (36.6 C)    Body mass index is 26.49 kg/m.  GENERAL: vitals reviewed and listed above, alert, oriented, appears well hydrated and in no acute distress  HEENT: atraumatic, conjunttiva clear, no obvious abnormalities on inspection of external nose and ears, normal appearance of ear canals and TMs, clear nasal congestion, mild post oropharyngeal erythema with PND, no tonsillar edema or exudate, no sinus TTP  NECK: no obvious masses on inspection  LUNGS: clear to auscultation bilaterally, no wheezes, rales or rhonchi, good air movement  CV: HRRR, no peripheral edema  MS: moves all extremities without noticeable abnormality  PSYCH: pleasant and cooperative, no obvious depression or anxiety  ASSESSMENT AND PLAN:  Discussed the following assessment and plan:  Respiratory illness  -we discussed possible serious and likely etiologies, workup and treatment, treatment risks and return precautions -  most likely viral infection or even mild flu; unlikely mold related -Regardless, it seems he has almost fully recovered other than some mild nasal congestion -after this discussion, [name] opted for observation, symptomatic treatment -follow up advised to 4 weeks with his regular primary care doctor given he has not had a regular follow-up visit in some time and given he is still quite concerned about the mold -of course, we advised [name]  to return or notify a doctor immediately if symptoms worsen or persist or new concerns arise.   Patient Instructions   Follow up with Dr. Sherren Mocha in 2-4 weeks.     Colin Benton R., DO

## 2016-02-15 NOTE — Patient Instructions (Signed)
Follow up with Dr. Sherren Mocha in 2-4 weeks.

## 2016-05-14 ENCOUNTER — Other Ambulatory Visit (INDEPENDENT_AMBULATORY_CARE_PROVIDER_SITE_OTHER): Payer: PRIVATE HEALTH INSURANCE

## 2016-05-14 DIAGNOSIS — Z0000 Encounter for general adult medical examination without abnormal findings: Secondary | ICD-10-CM | POA: Diagnosis not present

## 2016-05-14 LAB — LIPID PANEL
CHOLESTEROL: 213 mg/dL — AB (ref 0–200)
HDL: 79.5 mg/dL (ref >39.00)
LDL CALC: 125 mg/dL — AB (ref 0–99)
NonHDL: 133.32
TRIGLYCERIDES: 41 mg/dL (ref 0.0–149.0)
Total CHOL/HDL Ratio: 3
VLDL: 8.2 mg/dL (ref 0.0–40.0)

## 2016-05-14 LAB — CBC WITH DIFFERENTIAL/PLATELET
BASOS PCT: 0.7 % (ref 0.0–3.0)
Basophils Absolute: 0 10*3/uL (ref 0.0–0.1)
Eosinophils Absolute: 0.2 10*3/uL (ref 0.0–0.7)
Eosinophils Relative: 3.8 % (ref 0.0–5.0)
HCT: 42.8 % (ref 39.0–52.0)
Hemoglobin: 14.9 g/dL (ref 13.0–17.0)
LYMPHS ABS: 2 10*3/uL (ref 0.7–4.0)
Lymphocytes Relative: 41.3 % (ref 12.0–46.0)
MCHC: 34.7 g/dL (ref 30.0–36.0)
MCV: 93.9 fl (ref 78.0–100.0)
MONO ABS: 0.4 10*3/uL (ref 0.1–1.0)
MONOS PCT: 8.5 % (ref 3.0–12.0)
NEUTROS ABS: 2.3 10*3/uL (ref 1.4–7.7)
NEUTROS PCT: 45.7 % (ref 43.0–77.0)
Platelets: 285 10*3/uL (ref 150.0–400.0)
RBC: 4.56 Mil/uL (ref 4.22–5.81)
RDW: 12.9 % (ref 11.5–15.5)
WBC: 4.9 10*3/uL (ref 4.0–10.5)

## 2016-05-14 LAB — POC URINALSYSI DIPSTICK (AUTOMATED)
BILIRUBIN UA: NEGATIVE
Blood, UA: NEGATIVE
GLUCOSE UA: NEGATIVE
Ketones, UA: NEGATIVE
LEUKOCYTES UA: NEGATIVE
Nitrite, UA: NEGATIVE
PH UA: 6 (ref 5.0–8.0)
Protein, UA: NEGATIVE
Spec Grav, UA: 1.03 (ref 1.030–1.035)
Urobilinogen, UA: 0.2 (ref ?–2.0)

## 2016-05-14 LAB — HEPATIC FUNCTION PANEL
ALBUMIN: 4.3 g/dL (ref 3.5–5.2)
ALT: 22 U/L (ref 0–53)
AST: 17 U/L (ref 0–37)
Alkaline Phosphatase: 63 U/L (ref 39–117)
BILIRUBIN TOTAL: 1 mg/dL (ref 0.2–1.2)
Bilirubin, Direct: 0.2 mg/dL (ref 0.0–0.3)
Total Protein: 6.9 g/dL (ref 6.0–8.3)

## 2016-05-14 LAB — BASIC METABOLIC PANEL
BUN: 15 mg/dL (ref 6–23)
CHLORIDE: 105 meq/L (ref 96–112)
CO2: 28 meq/L (ref 19–32)
Calcium: 9.5 mg/dL (ref 8.4–10.5)
Creatinine, Ser: 0.94 mg/dL (ref 0.40–1.50)
GFR: 85.79 mL/min (ref >60.00)
GLUCOSE: 103 mg/dL — AB (ref 70–99)
Potassium: 4.1 mEq/L (ref 3.5–5.1)
SODIUM: 141 meq/L (ref 135–145)

## 2016-05-14 LAB — PSA: PSA: 0.68 ng/mL (ref 0.10–4.00)

## 2016-05-14 LAB — TSH: TSH: 2.09 u[IU]/mL (ref 0.35–4.50)

## 2016-05-21 ENCOUNTER — Encounter: Payer: Self-pay | Admitting: Family Medicine

## 2016-05-21 ENCOUNTER — Ambulatory Visit (INDEPENDENT_AMBULATORY_CARE_PROVIDER_SITE_OTHER): Payer: PRIVATE HEALTH INSURANCE | Admitting: Family Medicine

## 2016-05-21 VITALS — BP 130/84 | Temp 97.6°F | Ht 70.25 in | Wt 190.0 lb

## 2016-05-21 DIAGNOSIS — H900 Conductive hearing loss, bilateral: Secondary | ICD-10-CM

## 2016-05-21 DIAGNOSIS — N529 Male erectile dysfunction, unspecified: Secondary | ICD-10-CM | POA: Diagnosis not present

## 2016-05-21 DIAGNOSIS — J309 Allergic rhinitis, unspecified: Secondary | ICD-10-CM | POA: Diagnosis not present

## 2016-05-21 DIAGNOSIS — L301 Dyshidrosis [pompholyx]: Secondary | ICD-10-CM | POA: Diagnosis not present

## 2016-05-21 DIAGNOSIS — H01139 Eczematous dermatitis of unspecified eye, unspecified eyelid: Secondary | ICD-10-CM | POA: Diagnosis not present

## 2016-05-21 DIAGNOSIS — J4520 Mild intermittent asthma, uncomplicated: Secondary | ICD-10-CM

## 2016-05-21 DIAGNOSIS — Z136 Encounter for screening for cardiovascular disorders: Secondary | ICD-10-CM

## 2016-05-21 DIAGNOSIS — Z0000 Encounter for general adult medical examination without abnormal findings: Secondary | ICD-10-CM | POA: Diagnosis not present

## 2016-05-21 MED ORDER — SILDENAFIL CITRATE 20 MG PO TABS: ORAL_TABLET | ORAL | 11 refills | Status: DC

## 2016-05-21 MED ORDER — TADALAFIL 20 MG PO TABS: 20.0000 mg | ORAL_TABLET | Freq: Every day | ORAL | 11 refills | Status: DC | PRN

## 2016-05-21 MED ORDER — TRIAMCINOLONE ACETONIDE 0.025 % EX OINT: 1.0000 "application " | TOPICAL_OINTMENT | Freq: Two times a day (BID) | CUTANEOUS | 5 refills | Status: DC

## 2016-05-21 MED ORDER — ALBUTEROL SULFATE HFA 108 (90 BASE) MCG/ACT IN AERS: 2.0000 | INHALATION_SPRAY | Freq: Four times a day (QID) | RESPIRATORY_TRACT | 2 refills | Status: DC | PRN

## 2016-05-21 NOTE — Patient Instructions (Signed)
When you shower use warm water not hot and after you shower apply Lubriderm to your back  Call Dr. Audry Pili ophthalmologist for eye exam........ you need your cataracts removed  Dr. Gloriann Loan DDS  Nadene Rubins........... audiologist at [facility]  Return in one year for general physical exam sooner if any problems

## 2016-05-21 NOTE — Progress Notes (Signed)
Evan Parker is a 66 year old married male nonsmoker..... Self-employed does [employer]...Marland KitchenMarland KitchenMarland Kitchen who comes in today for general physical examination because history of allergic rhinitis, occasional asthma, erectile dysfunction and hearing loss  He uses albuterol as needed maybe once or twice a month when he has a flare of his asthma. 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 The Desyrel sometimes triggered some wheezing. He also takes Zyrtec every day  He uses Cialis 20 mg when necessary for ED  Uses Kenalog 0.1 twice a day for eczema  He has severe itching and dry skin. He also has a new lesion on his right forearm that won't heal. He is light skin and light eyes.  His vision is deteriorated point he can't see to drive at night. He did see Dr. Bing Plume at one point in time however he would like to see another ophthalmologist.  His hearing is gotten really bad. 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  He saw Simona Huh but didn't like the hygienist. Refer to Dr. Gloriann Loan  Last colonoscopy 06 was normal. Will refer him to GI for follow-up colonoscopy.  Immunizations tetanus 2012 declines a flu shot information given on shingles.  14 point review of systems reviewed and otherwise negative  BP 130/84 (BP Location: Left Arm, Patient Position: Sitting, Cuff Size: Normal)   Temp 97.6 F (36.4 C) (Oral)   Ht 5' 10.25" (1.784 m)   Wt 190 lb (86.2 kg)   BMI 27.07 kg/m  Examination HEENT was negative except for extremely dense cataracts unable to visualize his retina. Neck was supple thyroid not enlarged no carotid bruits. Cardiopulmonary exam normal abdominal exam normal genitalia normal circumcised male rectum normal stool guaiac-negative prostate normal extremities normal skin no peripheral pulses normal except for lesion right forearm that appears to be an inflamed actinic keratosis. Advised to come back to have it removed.  #1 allergic rhinitis........ continue  daily Zyrtec  #2 mild intermittent asthma....... albuterol when necessary  #3 severe hearing loss...Marland KitchenMarland KitchenMarland Kitchen referred to [facility]ologist at [facility]  #4 dry skin....... outlined dermatologic program  #5 abnormal lesion right forearm...Marland KitchenMarland KitchenMarland Kitchen return for removal  #6 family history of prostate cancer..... Father......Marland Kitchen recommend annual screening and DRE  #7 erectile dysfunction...Marland KitchenMarland KitchenMarland Kitchen refill Cialis

## 2016-05-21 NOTE — Progress Notes (Signed)
Pre visit review using our clinic review tool, if applicable. No additional management support is needed unless otherwise documented below in the visit note. 

## 2016-06-05 ENCOUNTER — Encounter: Payer: Self-pay | Admitting: Family Medicine

## 2016-06-05 ENCOUNTER — Ambulatory Visit (INDEPENDENT_AMBULATORY_CARE_PROVIDER_SITE_OTHER): Payer: PRIVATE HEALTH INSURANCE | Admitting: Family Medicine

## 2016-06-05 VITALS — BP 138/82 | Temp 98.4°F | Wt 191.0 lb

## 2016-06-05 DIAGNOSIS — L570 Actinic keratosis: Secondary | ICD-10-CM | POA: Insufficient documentation

## 2016-06-05 DIAGNOSIS — D0462 Carcinoma in situ of skin of left upper limb, including shoulder: Secondary | ICD-10-CM | POA: Diagnosis not present

## 2016-06-05 NOTE — Progress Notes (Signed)
Pre visit review using our clinic review tool, if applicable. No additional management support is needed unless otherwise documented below in the visit note. 

## 2016-06-05 NOTE — Patient Instructions (Signed)
We will call you within 2 weeks with the path report......... if we do not call you in 3 weeks call us

## 2016-06-05 NOTE — Progress Notes (Signed)
Raffield a 66 year old married male nonsmoker comes in today for follow-up of his skin  Visit light skin and blue eyes is had a lot of sun damage. He works outdoors.  He had a lesion on his right forearm that has spontaneously resolved however he's got a lesion on his left forearm is been there for about 2 years it's increasing in size.  #1 8 mm x 8 mm lesion left forearm red ring with a central crusting consistent with a possible early skin cancer  BP 138/82 (BP Location: Left Arm, Patient Position: Sitting, Cuff Size: Normal)   Temp 98.4 F (36.9 C) (Oral)   Wt 191 lb (86.6 kg)   BMI 27.21 kg/m  After informed consent the patient was taken to treatment room. The lesion was cleaned with alcohol and anesthetized with 1% Xylocaine. It was excised with 2 mm margins. Base was cauterized Band-Aid was applied. The lesion was sent for pathologic analysis  #1 8 mm x 8 mm red crusty lesion left forearm.........Marland Kitchen removed sent for pathology rule out skin cancer clinically it appears to be an irritated AK which is a premalignant lesion,,,,,

## 2016-06-05 NOTE — Addendum Note (Signed)
Addended by: Miles Costain T on: 06/05/2016 01:21 PM   Modules accepted: Orders

## 2016-11-04 ENCOUNTER — Ambulatory Visit (INDEPENDENT_AMBULATORY_CARE_PROVIDER_SITE_OTHER): Payer: PRIVATE HEALTH INSURANCE | Admitting: Family Medicine

## 2016-11-04 ENCOUNTER — Encounter: Payer: Self-pay | Admitting: Family Medicine

## 2016-11-04 VITALS — BP 140/82 | HR 62 | Temp 98.1°F | Wt 187.0 lb

## 2016-11-04 DIAGNOSIS — D049 Carcinoma in situ of skin, unspecified: Secondary | ICD-10-CM

## 2016-11-04 NOTE — Patient Instructions (Signed)
Return in 6 months for annual checkups sooner if any problems

## 2016-11-04 NOTE — Progress Notes (Signed)
Evan Parker is a 66 year old male comes in today for follow-up of an abnormal skin lesion  We saw him last spring with a seborrheic keratosis on his left forearm however it had a central pit that did not look normal. The lesion was excised in toto under local anesthesia with 3 mm margins. It can back seborrheic keratosis with a central squamous cell carcinoma that did not extend to the margins. He comes in today for follow-up.  He says he sees no recurrence  BP 140/82 (BP Location: Left Arm, Patient Position: Sitting, Cuff Size: Normal)   Pulse 62   Temp 98.1 F (36.7 C) (Oral)   Wt 187 lb (84.8 kg)   BMI 26.64 kg/m  Examination skin shows a scar from previous surgery in spring 2018 as noted above. No evidence of recurrence.  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 Follow-up in 6 months sooner when necessary

## 2016-11-15 ENCOUNTER — Encounter: Payer: Self-pay | Admitting: Family Medicine

## 2016-12-16 ENCOUNTER — Ambulatory Visit: Payer: PRIVATE HEALTH INSURANCE

## 2017-05-21 ENCOUNTER — Ambulatory Visit (INDEPENDENT_AMBULATORY_CARE_PROVIDER_SITE_OTHER): Payer: Medicare Other | Admitting: Family Medicine

## 2017-05-21 ENCOUNTER — Encounter: Payer: Self-pay | Admitting: Family Medicine

## 2017-05-21 ENCOUNTER — Encounter: Payer: Self-pay | Admitting: Internal Medicine

## 2017-05-21 VITALS — BP 138/82 | HR 52 | Temp 97.5°F | Ht 70.0 in | Wt 182.0 lb

## 2017-05-21 DIAGNOSIS — N529 Male erectile dysfunction, unspecified: Secondary | ICD-10-CM | POA: Diagnosis not present

## 2017-05-21 DIAGNOSIS — N401 Enlarged prostate with lower urinary tract symptoms: Secondary | ICD-10-CM

## 2017-05-21 DIAGNOSIS — J309 Allergic rhinitis, unspecified: Secondary | ICD-10-CM | POA: Diagnosis not present

## 2017-05-21 DIAGNOSIS — Z9842 Cataract extraction status, left eye: Secondary | ICD-10-CM

## 2017-05-21 DIAGNOSIS — R351 Nocturia: Secondary | ICD-10-CM | POA: Diagnosis not present

## 2017-05-21 DIAGNOSIS — Z0000 Encounter for general adult medical examination without abnormal findings: Secondary | ICD-10-CM

## 2017-05-21 DIAGNOSIS — J4520 Mild intermittent asthma, uncomplicated: Secondary | ICD-10-CM

## 2017-05-21 DIAGNOSIS — H01139 Eczematous dermatitis of unspecified eye, unspecified eyelid: Secondary | ICD-10-CM | POA: Diagnosis not present

## 2017-05-21 DIAGNOSIS — M25551 Pain in right hip: Secondary | ICD-10-CM

## 2017-05-21 DIAGNOSIS — L301 Dyshidrosis [pompholyx]: Secondary | ICD-10-CM

## 2017-05-21 DIAGNOSIS — L309 Dermatitis, unspecified: Secondary | ICD-10-CM

## 2017-05-21 DIAGNOSIS — Z136 Encounter for screening for cardiovascular disorders: Secondary | ICD-10-CM | POA: Diagnosis not present

## 2017-05-21 DIAGNOSIS — H9190 Unspecified hearing loss, unspecified ear: Secondary | ICD-10-CM

## 2017-05-21 DIAGNOSIS — Z9841 Cataract extraction status, right eye: Secondary | ICD-10-CM

## 2017-05-21 LAB — CBC WITH DIFFERENTIAL/PLATELET
BASOS ABS: 0 10*3/uL (ref 0.0–0.1)
BASOS PCT: 0.9 % (ref 0.0–3.0)
EOS PCT: 3.7 % (ref 0.0–5.0)
Eosinophils Absolute: 0.2 10*3/uL (ref 0.0–0.7)
HEMATOCRIT: 44 % (ref 39.0–52.0)
Hemoglobin: 15.3 g/dL (ref 13.0–17.0)
Lymphocytes Relative: 42.4 % (ref 12.0–46.0)
Lymphs Abs: 2 10*3/uL (ref 0.7–4.0)
MCHC: 34.7 g/dL (ref 30.0–36.0)
MCV: 94.6 fl (ref 78.0–100.0)
MONOS PCT: 7.8 % (ref 3.0–12.0)
Monocytes Absolute: 0.4 10*3/uL (ref 0.1–1.0)
NEUTROS ABS: 2.2 10*3/uL (ref 1.4–7.7)
NEUTROS PCT: 45.2 % (ref 43.0–77.0)
PLATELETS: 303 10*3/uL (ref 150.0–400.0)
RBC: 4.65 Mil/uL (ref 4.22–5.81)
RDW: 13.7 % (ref 11.5–15.5)
WBC: 4.8 10*3/uL (ref 4.0–10.5)

## 2017-05-21 LAB — PSA: PSA: 0.84 ng/mL (ref 0.10–4.00)

## 2017-05-21 LAB — BASIC METABOLIC PANEL
BUN: 15 mg/dL (ref 6–23)
CHLORIDE: 101 meq/L (ref 96–112)
CO2: 31 meq/L (ref 19–32)
Calcium: 9.4 mg/dL (ref 8.4–10.5)
Creatinine, Ser: 0.81 mg/dL (ref 0.40–1.50)
GFR: 101.54 mL/min (ref >60.00)
Glucose, Bld: 94 mg/dL (ref 70–99)
POTASSIUM: 4.5 meq/L (ref 3.5–5.1)
Sodium: 139 mEq/L (ref 135–145)

## 2017-05-21 LAB — LIPID PANEL
CHOL/HDL RATIO: 2
Cholesterol: 207 mg/dL — ABNORMAL HIGH (ref 0–200)
HDL: 84.3 mg/dL (ref >39.00)
LDL CALC: 114 mg/dL — AB (ref 0–99)
NonHDL: 122.76
TRIGLYCERIDES: 42 mg/dL (ref 0.0–149.0)
VLDL: 8.4 mg/dL (ref 0.0–40.0)

## 2017-05-21 LAB — POCT URINALYSIS DIPSTICK
Bilirubin, UA: NEGATIVE
Blood, UA: NEGATIVE
GLUCOSE UA: NEGATIVE
KETONES UA: NEGATIVE
Leukocytes, UA: NEGATIVE
NITRITE UA: NEGATIVE
PROTEIN UA: NEGATIVE
Spec Grav, UA: 1.02 (ref 1.010–1.025)
Urobilinogen, UA: 0.2 E.U./dL
pH, UA: 6 (ref 5.0–8.0)

## 2017-05-21 LAB — HEPATIC FUNCTION PANEL
ALBUMIN: 4.2 g/dL (ref 3.5–5.2)
ALT: 21 U/L (ref 0–53)
AST: 19 U/L (ref 0–37)
Alkaline Phosphatase: 56 U/L (ref 39–117)
Bilirubin, Direct: 0.1 mg/dL (ref 0.0–0.3)
TOTAL PROTEIN: 7.2 g/dL (ref 6.0–8.3)
Total Bilirubin: 0.7 mg/dL (ref 0.2–1.2)

## 2017-05-21 LAB — TSH: TSH: 2.06 u[IU]/mL (ref 0.35–4.50)

## 2017-05-21 MED ORDER — TRIAMCINOLONE ACETONIDE 0.025 % EX OINT: 1.0000 "application " | TOPICAL_OINTMENT | Freq: Two times a day (BID) | CUTANEOUS | 5 refills | Status: DC

## 2017-05-21 MED ORDER — ALBUTEROL SULFATE HFA 108 (90 BASE) MCG/ACT IN AERS: 2.0000 | INHALATION_SPRAY | Freq: Four times a day (QID) | RESPIRATORY_TRACT | 2 refills | Status: AC | PRN

## 2017-05-21 MED ORDER — SILDENAFIL CITRATE 20 MG PO TABS: ORAL_TABLET | ORAL | 11 refills | Status: DC

## 2017-05-21 NOTE — Patient Instructions (Signed)
Labs today........ I will call you with any abnormalities  Go to the main office on [facility].......Marland Kitchen X-ray of your hips....... I'll call you the report  Consider an audiogram at [facility],,,,,,,,, hearing protection!!!!!!!!!!!!!!!!!  Generic Viagra...........Marland Kitchen 1 or 2 tabs 2-3 hours prior to sex  Motrin 400 mg twice a day for hip pain  For the eczema remember to use the triamcinolone with Eucerin or Lubriderm........... Small amounts twice daily until clear

## 2017-05-21 NOTE — Progress Notes (Signed)
Evan Parker is a 67 year old married male nonsmoker....... Owns his own [employer].... Who comes in today for evaluation for the following problems  Over the past year he's had more difficulty with pain in his right hip area points the right groin as a source of his pain. When he walks slowly it doesn't bother him. When he walks fast he developed severe pain and has to stop.  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  He had bilateral cataracts and lens implants by Dr. Gershon Crane last fall.  He has a history of allergic rhinitis which he takes over-the-counter Zyrtec. He also has an occasional flare of asthma when he gets a bad cold and uses albuterol. Last year use the albuterol for couple days when he had a bad cold.  He uses generic Viagra for ED  He gets routine eye care, dental care, colonoscopy last 2006. Does not recall getting a recall card. Also note to GI.  Vaccinations.... Do a Prevnar 13 which we will given today. Information given on shingles vaccine. Advised to get an annual flu shot.  Cognitive function normal he is in excellent shape because he works every day, home health safety reviewed no issues identified, no guns in the house, he does have a healthcare power of attorney and living well  14 point review of systems otherwise negative except for above  EKG was done because of a history of atypical chest pain. EKG was normal and unchanged.  BP 138/82 (BP Location: Left Arm, Patient Position: Sitting, Cuff Size: Normal)   Pulse (!) 52   Temp (!) 97.5 F (36.4 C) (Oral)   Ht 5\' 10"  (1.778 m)   Wt 182 lb (82.6 kg)   BMI 26.11 kg/m  Well-developed well-nourished male no acute distress obvious hearing deficit. Examination HEENT was pertinent he had evidence of bilateral cataract extraction with new lenses.  Neck is supple thyroid is not large no carotid bruits. Cardiopulmonary exam normal abdominal exam normal genitalia normal circumcised male with normal stool guaiac-negative prostate normal extremities normal skin normal peripheral pulses normal except for scar right shoulder from previous shoulder surgery.  Orthopedic exam done because a history of right hip pain. Left hip external rotation about 75 right hip external rotation about 10.  #1 allergic rhinitis..... Continue current medication  History of asthma related to viral infections.......Marland Kitchen Albuterol when necessary  #3 right hip pain...........Marland Kitchen X-ray right hip  #4 mild ED......... Continue generic Viagra  #5 dyshidrotic eczema........ Constrictor continue steroid gel and Lubriderm twice a day when necessary  #6 hearing loss.........Marland Kitchen Recommend an audiogram at [facility]  #7 status post bilateral cataract removal and lens implants 2018

## 2017-05-22 ENCOUNTER — Other Ambulatory Visit: Payer: Self-pay | Admitting: Family Medicine

## 2017-05-22 ENCOUNTER — Ambulatory Visit (INDEPENDENT_AMBULATORY_CARE_PROVIDER_SITE_OTHER)
Admission: RE | Admit: 2017-05-22 | Discharge: 2017-05-22 | Disposition: A | Discharge status: Home / Self Care | Payer: Medicare Other | Source: Ambulatory Visit | Attending: Family Medicine | Admitting: Family Medicine

## 2017-05-22 DIAGNOSIS — M25551 Pain in right hip: Secondary | ICD-10-CM

## 2017-07-25 ENCOUNTER — Ambulatory Visit (AMBULATORY_SURGERY_CENTER): Payer: Self-pay | Admitting: *Deleted

## 2017-07-25 ENCOUNTER — Other Ambulatory Visit: Payer: Self-pay

## 2017-07-25 VITALS — Ht 71.0 in | Wt 186.0 lb

## 2017-07-25 DIAGNOSIS — Z1211 Encounter for screening for malignant neoplasm of colon: Secondary | ICD-10-CM

## 2017-07-25 MED ORDER — NA SULFATE-K SULFATE-MG SULF 17.5-3.13-1.6 GM/177ML PO SOLN: ORAL | 0 refills | Status: DC

## 2017-07-25 NOTE — Progress Notes (Signed)
Patient denies any allergies to eggs or soy. Patient denies any problems with anesthesia/sedation. Patient denies any oxygen use at home. Patient denies taking any diet/weight loss medications or blood thinners. EMMI education assisgned to patient on colonoscopy, this was explained and instructions given to patient. 

## 2017-08-04 ENCOUNTER — Encounter: Payer: Self-pay | Admitting: Internal Medicine

## 2017-08-04 ENCOUNTER — Ambulatory Visit (AMBULATORY_SURGERY_CENTER): Payer: Medicare Other | Admitting: Internal Medicine

## 2017-08-04 ENCOUNTER — Other Ambulatory Visit: Payer: Self-pay

## 2017-08-04 VITALS — BP 131/58 | HR 58 | Temp 98.6°F | Resp 11 | Ht 70.0 in | Wt 182.0 lb

## 2017-08-04 DIAGNOSIS — D125 Benign neoplasm of sigmoid colon: Secondary | ICD-10-CM

## 2017-08-04 DIAGNOSIS — D122 Benign neoplasm of ascending colon: Secondary | ICD-10-CM

## 2017-08-04 DIAGNOSIS — Z1211 Encounter for screening for malignant neoplasm of colon: Secondary | ICD-10-CM | POA: Diagnosis present

## 2017-08-04 DIAGNOSIS — K635 Polyp of colon: Secondary | ICD-10-CM

## 2017-08-04 MED ORDER — SODIUM CHLORIDE 0.9 % IV SOLN: 500.0000 mL | Freq: Once | INTRAVENOUS | Status: AC

## 2017-08-04 NOTE — Progress Notes (Signed)
Pt's states no medical or surgical changes since previsit or office visit. 

## 2017-08-04 NOTE — Progress Notes (Signed)
In procedure room pt complained of pain at IV site.  A swollen knot was noted.  That IV d/c'd.  Attempted 25g in r hand that blew.  Switched to left [facility] with one stick success 25g

## 2017-08-04 NOTE — Patient Instructions (Signed)
YOU HAD AN ENDOSCOPIC PROCEDURE TODAY AT THE [facility]:   Refer to the procedure report that was given to you for any specific questions about what was found during the examination.  If the procedure report does not answer your questions, please call your gastroenterologist to clarify.  If you requested that your care partner not be given the details of your procedure findings, then the procedure report has been included in a sealed envelope for you to review at your convenience later.  YOU SHOULD EXPECT: Some feelings of bloating in the abdomen. Passage of more gas than usual.  Walking can help get rid of the air that was put into your GI tract during the procedure and reduce the bloating. If you had a lower endoscopy (such as a colonoscopy or flexible sigmoidoscopy) you may notice spotting of blood in your stool or on the toilet paper. If you underwent a bowel prep for your procedure, you may not have a normal bowel movement for a few days.  Please Note:  You might notice some irritation and congestion in your nose or some drainage.  This is from the oxygen used during your procedure.  There is no need for concern and it should clear up in a day or so.  SYMPTOMS TO REPORT IMMEDIATELY:   Following lower endoscopy (colonoscopy or flexible sigmoidoscopy):  Excessive amounts of blood in the stool  Significant tenderness or worsening of abdominal pains  Swelling of the abdomen that is new, acute  Fever of 100F or higher   Following upper endoscopy (EGD)  Vomiting of blood or coffee ground material  New chest pain or pain under the shoulder blades  Painful or persistently difficult swallowing  New shortness of breath  Fever of 100F or higher  Black, tarry-looking stools  For urgent or emergent issues, a gastroenterologist can be reached at any hour by calling [phone].   DIET:  We do recommend a small meal at first, but then you may proceed to your regular diet.  Drink  plenty of fluids but you should avoid alcoholic beverages for 24 hours.  ACTIVITY:  You should plan to take it easy for the rest of today and you should NOT DRIVE or use heavy machinery until tomorrow (because of the sedation medicines used during the test).    FOLLOW UP: Our staff will call the number listed on your records the next business day following your procedure to check on you and address any questions or concerns that you may have regarding the information given to you following your procedure. If we do not reach you, we will leave a message.  However, if you are feeling well and you are not experiencing any problems, there is no need to return our call.  We will assume that you have returned to your regular daily activities without incident.  If any biopsies were taken you will be contacted by phone or by letter within the next 1-3 weeks.  Please call us at [phone] if you have not heard about the biopsies in 3 weeks.   Await for biopsy results Polyps (handout given) Diverticulosis (handout given) Hemorrhoids (handout given)  SIGNATURES/CONFIDENTIALITY: You and/or your care partner have signed paperwork which will be entered into your electronic medical record.  These signatures attest to the fact that that the information above on your After Visit Summary has been reviewed and is understood.  Full responsibility of the confidentiality of this discharge information lies with you and/or your   care-partner.

## 2017-08-04 NOTE — Progress Notes (Signed)
Called to room to assist during endoscopic procedure.  Patient ID and intended procedure confirmed with present staff. Received instructions for my participation in the procedure from the performing physician.  

## 2017-08-04 NOTE — Op Note (Signed)
[place] Patient Name: Evan Parker Procedure Date: 08/04/2017 10:41 AM MRN: [id number] Endoscopist: Docia Chuck. Henrene Pastor , MD Age: 67 Referring MD:  Date of Birth: Jun 20, 1950 Gender: Male Account #: [id number] Procedure:                Colonoscopy, with cold snare polypectomy x 2 Indications:              Screening for colorectal malignant neoplasm.                            Negative index exam 2006 Medicines:                Monitored Anesthesia Care Procedure:                Pre-Anesthesia Assessment:                           - Prior to the procedure, a History and Physical                            was performed, and patient medications and                            allergies were reviewed. The patient's tolerance of                            previous anesthesia was also reviewed. The risks                            and benefits of the procedure and the sedation                            options and risks were discussed with the patient.                            All questions were answered, and informed consent                            was obtained. Prior Anticoagulants: The patient has                            taken no previous anticoagulant or antiplatelet                            agents. ASA Grade Assessment: II - A patient with                            mild systemic disease. After reviewing the risks                            and benefits, the patient was deemed in                            satisfactory condition to undergo the procedure.  After obtaining informed consent, the colonoscope                            was passed under direct vision. Throughout the                            procedure, the patient's blood pressure, pulse, and                            oxygen saturations were monitored continuously. The                            Colonoscope was introduced through the anus and                            advanced to  the the cecum, identified by                            appendiceal orifice and ileocecal valve. The                            ileocecal valve, appendiceal orifice, and rectum                            were photographed. The quality of the bowel                            preparation was excellent. The colonoscopy was                            performed without difficulty. The patient tolerated                            the procedure well. The bowel preparation used was                            SUPREP. Scope In: 10:49:43 AM Scope Out: 11:07:13 AM Scope Withdrawal Time: 0 hours 14 minutes 40 seconds  Total Procedure Duration: 0 hours 17 minutes 30 seconds  Findings:                 Two polyps were found in the sigmoid colon and                            ascending colon. The polyps were 1 to 2 mm in size.                            These polyps were removed with a cold snare.                            Resection and retrieval were complete.                           Multiple diverticula were found in the sigmoid  colon.                           Internal hemorrhoids were found during retroflexion.                           The exam was otherwise without abnormality on                            direct and retroflexion views. Complications:            No immediate complications. Estimated blood loss:                            None. Estimated Blood Loss:     Estimated blood loss: none. Impression:               - Two 1 to 2 mm polyps in the sigmoid colon and in                            the ascending colon, removed with a cold snare.                            Resected and retrieved.                           - Diverticulosis in the sigmoid colon.                           - Internal hemorrhoids.                           - The examination was otherwise normal on direct                            and retroflexion views. Recommendation:           - Repeat  colonoscopy in 5-10 years for surveillance.                           - Patient has a contact number available for                            emergencies. The signs and symptoms of potential                            delayed complications were discussed with the                            patient. Return to normal activities tomorrow.                            Written discharge instructions were provided to the                            patient.                           -  Resume previous diet.                           - Continue present medications.                           - Await pathology results. Docia Chuck. Henrene Pastor, MD 08/04/2017 11:12:05 AM This report has been signed electronically.

## 2017-08-04 NOTE — Progress Notes (Signed)
Report to PACU, RN, vss, BBS= Clear.  

## 2017-08-05 ENCOUNTER — Telehealth: Payer: Self-pay

## 2017-08-05 ENCOUNTER — Telehealth: Payer: Self-pay | Admitting: *Deleted

## 2017-08-05 NOTE — Telephone Encounter (Signed)
  Follow up Call-  Call back number 08/04/2017  Post procedure Call Back phone  # [phone]  Permission to leave phone message Yes  Some recent data might be hidden     Patient questions:  Do you have a fever, pain , or abdominal swelling? No. Pain Score  0 *  Have you tolerated food without any problems? Yes.    Have you been able to return to your normal activities? Yes.    Do you have any questions about your discharge instructions: Diet   No. Medications  No. Follow up visit  No.  Do you have questions or concerns about your Care? No.  Actions: * If pain score is 4 or above: No action needed, pain <4.

## 2017-08-05 NOTE — Telephone Encounter (Signed)
Left message, will try to call back later today, B.Tanza Pellot RN 

## 2017-08-18 ENCOUNTER — Encounter: Payer: Self-pay | Admitting: Internal Medicine

## 2018-12-08 ENCOUNTER — Telehealth: Payer: Self-pay | Admitting: Family Medicine

## 2018-12-08 NOTE — Telephone Encounter (Signed)
OK to set up 

## 2018-12-08 NOTE — Telephone Encounter (Signed)
Copied from [place] [id number]. Topic: Appointment Scheduling - Transfer of Care >> Dec 07, 2018 11:59 AM Alease Frame wrote: Reason for CRM: Patient would like to sch physical before this year is out   Route to Engineer, building services.  Pt is requesting to transfer FROM:Dr Todd  Pt is requesting to transfer TO: Dr Sheldon Silvan Reason for requested transfer: Dr Sherren Mocha is no longer at practice   Send CRM to patient's current PCP (transferring FROM). Call back [phone]  (Wife) Othello Andrada >> Dec 07, 2018 12:05 PM Cox, Melburn Hake, CMA wrote: Per provider [facility]) he is not accepting any NPs at this time. Pt may choose another provider currently accepting NPs. Wife/caller will also need TOC, [employer] (Todd pt).   Can I schedule a TOC for this patient with Dr. Elease Hashimoto?

## 2018-12-09 NOTE — Telephone Encounter (Signed)
LMVM for the patient to contact the office to schedule a TOC appointment with Dr. Elease Hashimoto from Dr. Sherren Mocha.

## 2019-01-11 ENCOUNTER — Other Ambulatory Visit: Payer: Self-pay

## 2019-01-11 ENCOUNTER — Encounter: Payer: Medicare HMO | Admitting: Family Medicine

## 2019-01-25 ENCOUNTER — Ambulatory Visit (INDEPENDENT_AMBULATORY_CARE_PROVIDER_SITE_OTHER): Payer: Medicare HMO | Admitting: Family Medicine

## 2019-01-25 ENCOUNTER — Encounter: Payer: Self-pay | Admitting: Family Medicine

## 2019-01-25 ENCOUNTER — Other Ambulatory Visit: Payer: Self-pay

## 2019-01-25 VITALS — BP 110/70 | HR 66 | Ht 70.0 in | Wt 187.0 lb

## 2019-01-25 DIAGNOSIS — H6122 Impacted cerumen, left ear: Secondary | ICD-10-CM | POA: Insufficient documentation

## 2019-01-25 DIAGNOSIS — K5901 Slow transit constipation: Secondary | ICD-10-CM

## 2019-01-25 DIAGNOSIS — Z0000 Encounter for general adult medical examination without abnormal findings: Secondary | ICD-10-CM | POA: Diagnosis not present

## 2019-01-25 LAB — LIPID PANEL
Cholesterol: 197 mg/dL (ref 0–200)
HDL: 78 mg/dL (ref >39.00)
LDL Cholesterol: 112 mg/dL — ABNORMAL HIGH (ref 0–99)
NonHDL: 119.18
Total CHOL/HDL Ratio: 3
Triglycerides: 38 mg/dL (ref 0.0–149.0)
VLDL: 7.6 mg/dL (ref 0.0–40.0)

## 2019-01-25 LAB — URINALYSIS, ROUTINE W REFLEX MICROSCOPIC
Bilirubin Urine: NEGATIVE
Hgb urine dipstick: NEGATIVE
Ketones, ur: NEGATIVE
Leukocytes,Ua: NEGATIVE
Nitrite: NEGATIVE
RBC / HPF: NONE SEEN (ref 0–?)
Specific Gravity, Urine: >=1.03 — AB (ref 1.000–1.030)
Total Protein, Urine: NEGATIVE
Urine Glucose: NEGATIVE
Urobilinogen, UA: 0.2 (ref 0.0–1.0)
pH: 5.5 (ref 5.0–8.0)

## 2019-01-25 LAB — COMPREHENSIVE METABOLIC PANEL
ALT: 19 U/L (ref 0–53)
AST: 17 U/L (ref 0–37)
Albumin: 3.9 g/dL (ref 3.5–5.2)
Alkaline Phosphatase: 58 U/L (ref 39–117)
BUN: 16 mg/dL (ref 6–23)
CO2: 28 mEq/L (ref 19–32)
Calcium: 9.3 mg/dL (ref 8.4–10.5)
Chloride: 105 mEq/L (ref 96–112)
Creatinine, Ser: 0.82 mg/dL (ref 0.40–1.50)
GFR: 93.71 mL/min (ref >60.00)
Glucose, Bld: 97 mg/dL (ref 70–99)
Potassium: 4.2 mEq/L (ref 3.5–5.1)
Sodium: 140 mEq/L (ref 135–145)
Total Bilirubin: 1 mg/dL (ref 0.2–1.2)
Total Protein: 6.7 g/dL (ref 6.0–8.3)

## 2019-01-25 LAB — CBC
HCT: 42.7 % (ref 39.0–52.0)
Hemoglobin: 14.6 g/dL (ref 13.0–17.0)
MCHC: 34.1 g/dL (ref 30.0–36.0)
MCV: 94.8 fl (ref 78.0–100.0)
Platelets: 289 10*3/uL (ref 150.0–400.0)
RBC: 4.51 Mil/uL (ref 4.22–5.81)
RDW: 13.4 % (ref 11.5–15.5)
WBC: 5.2 10*3/uL (ref 4.0–10.5)

## 2019-01-25 LAB — PSA: PSA: 0.71 ng/mL (ref 0.10–4.00)

## 2019-01-25 MED ORDER — METAMUCIL SMOOTH TEXTURE 58.6 % PO POWD: 1.0000 | Freq: Three times a day (TID) | ORAL | 12 refills | Status: AC

## 2019-01-25 MED ORDER — EAR WAX CLEANSING 6.5 % OT KIT: PACK | OTIC | 2 refills | Status: DC

## 2019-01-25 NOTE — Progress Notes (Addendum)
Established Patient Office Visit  Subjective:  Patient ID: Evan Parker, male    DOB: 1950/05/31  Age: 68 y.o. MRN: [id number]  CC:  Chief Complaint  Patient presents with  . Annual Exam    HPI Evan Parker presents for establishment of care by way of transfer.  He is enjoyed good health in the years.  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  Seems to be holding up well for this.  He lives at home with his wife.  He has 2 sons.  1 passed in the other seems to be making some bad decisions.  Patient is trying to find a dentist for his multiple dental issues.  Sees eye doctor regularly.  Status post colonoscopy this past year.  Enjoys a glass of wine nightly.  He enjoys cooking for himself.  Has some problems with constipation and itchy ears.  History of dyshidrotic eczema and has occasional back pain.  Has had problems with sinus infections in the past.  Has problems with constipation that he thinks is due to increased chocolate ingestion.  He has problems with itchy ears.  Past Medical History:  Diagnosis Date  . ALLERGIC RHINITIS 04/27/2009  . Allergy   . ASTHMA, ACUTE 03/02/2010  . CHEST PAIN 04/27/2009  . CLOSED DISLOCATION OF ACROMIOCLAVICULAR 11/30/2007  . 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 GERD 10/21/2006    Past Surgical History:  Procedure Laterality Date  . COLONOSCOPY  2006  . EYE SURGERY     foreign body R eye  . SHOULDER SURGERY Right 01/2013  . TONSILLECTOMY AND ADENOIDECTOMY      Family History  Problem Relation Age of Onset  . Hyperlipidemia Other   . Colon cancer Neg Hx   . Esophageal cancer Neg Hx   . Stomach cancer Neg Hx     Social History   Socioeconomic History  . Marital status: Married    Spouse name: Not on file  . Number of children: Not on file  . Years of education: Not on file  . Highest education level: Not on file  Occupational History  . Not on file  Social Needs  . Financial resource  strain: Not on file  . Food insecurity    Worry: Not on file    Inability: Not on file  . Transportation needs    Medical: Not on file    Non-medical: Not on file  Tobacco Use  . Smoking status: Never Smoker  . Smokeless tobacco: Former Systems developer    Types: Chew  Substance and Sexual Activity  . Alcohol use: Yes    Alcohol/week: 7.0 standard drinks    Types: 7 Cans of beer per week  . Drug use: Not Currently  . Sexual activity: Yes  Lifestyle  . Physical activity    Days per week: Not on file    Minutes per session: Not on file  . Stress: Not on file  Relationships  . Social Herbalist on phone: Not on file    Gets together: Not on file    Attends religious service: Not on file    Active member of club or organization: Not on file    Attends meetings of clubs or organizations: Not on file    Relationship status: Not on file  . Intimate partner violence    Fear of current or ex partner: Not on file    Emotionally abused: Not on file  Physically abused: Not on file    Forced sexual activity: Not on file  Other Topics Concern  . Not on file  Social History Narrative  . Not on file    Outpatient Medications Prior to Visit  Medication Sig Dispense Refill  . albuterol (VENTOLIN HFA) 108 (90 Base) MCG/ACT inhaler Inhale 2 puffs into the lungs every 6 (six) hours as needed. 1 Inhaler 2  . cetirizine (ZYRTEC) 10 MG tablet Take 10 mg by mouth daily.      . sildenafil (REVATIO) 20 MG tablet Take 1 to 2 tabs 2 - 3 hours before sex 30 tablet 11  . tadalafil (CIALIS) 20 MG tablet Take 1 tablet (20 mg total) by mouth daily as needed. 10 tablet 11  . triamcinolone (KENALOG) 0.025 % ointment Apply 1 application topically 2 (two) times daily. (Patient not taking: Reported on 08/04/2017) 80 g 5   Facility-Administered Medications Prior to Visit  Medication Dose Route Frequency Provider Last Rate Last Dose  . 0.9 %  sodium chloride infusion  500 mL Intravenous Once Irene Shipper,  MD        No Known Allergies  ROS Review of Systems  Constitutional: Negative for diaphoresis, fatigue, fever and unexpected weight change.  HENT: Positive for hearing loss.   Eyes: Negative for photophobia and visual disturbance.  Respiratory: Negative.   Cardiovascular: Negative.   Gastrointestinal: Negative.   Endocrine: Negative for polyphagia.  Genitourinary: Negative for difficulty urinating, frequency, hematuria and urgency.  Musculoskeletal: Positive for arthralgias.  Skin: Negative for pallor and rash.  Allergic/Immunologic: Negative for immunocompromised state.  Neurological: Negative for syncope and speech difficulty.  Hematological: Does not bruise/bleed easily.  Psychiatric/Behavioral: Negative.       Objective:    Physical Exam  Constitutional: He is oriented to person, place, and time. He appears well-developed and well-nourished. No distress.  HENT:  Head: Normocephalic and atraumatic.  Right Ear: External ear normal.  Left Ear: External ear normal.  Mouth/Throat: Oropharynx is clear and moist. No oropharyngeal exudate.  Eyes: Pupils are equal, round, and reactive to light. Conjunctivae are normal. Right eye exhibits no discharge. Left eye exhibits no discharge. No scleral icterus.  Neck: No JVD present. No tracheal deviation present. No thyromegaly present.  Cardiovascular: Normal rate, regular rhythm and normal heart sounds.  Pulmonary/Chest: Effort normal and breath sounds normal. No stridor.  Abdominal: Bowel sounds are normal. He exhibits no distension and no mass. There is no abdominal tenderness. There is no rebound and no guarding.  Genitourinary: Rectum:     Guaiac result negative.     No rectal mass, anal fissure, tenderness, external hemorrhoid, internal hemorrhoid or abnormal anal tone.  Prostate is enlarged. Prostate is not tender.  Musculoskeletal:        General: No edema.  Lymphadenopathy:    He has no cervical adenopathy.  Neurological:  He is alert and oriented to person, place, and time.  Skin: Skin is warm and dry. He is not diaphoretic.  Psychiatric: He has a normal mood and affect.    BP 110/70   Pulse 66   Ht 5' 10"  (1.778 m)   Wt 187 lb (84.8 kg)   SpO2 90%   BMI 26.83 kg/m  Wt Readings from Last 3 Encounters:  01/25/19 187 lb (84.8 kg)  08/04/17 182 lb (82.6 kg)  07/25/17 186 lb (84.4 kg)   BP Readings from Last 3 Encounters:  01/25/19 110/70  08/04/17 (!) 131/58  05/21/17 138/82  Guideline developer:  UpToDate (see UpToDate for funding source) Date Released: June 2014  Health Maintenance Due  Topic Date Due  . Hepatitis C Screening  [birth year]  . PNA vac Low Risk Adult (1 of 2 - PCV13) 01/17/2017  . INFLUENZA VACCINE  09/26/2018    There are no preventive care reminders to display for this patient.  Lab Results  Component Value Date   TSH 2.06 05/21/2017   Lab Results  Component Value Date   WBC 4.8 05/21/2017   HGB 15.3 05/21/2017   HCT 44.0 05/21/2017   MCV 94.6 05/21/2017   PLT 303.0 05/21/2017   Lab Results  Component Value Date   NA 139 05/21/2017   K 4.5 05/21/2017   CO2 31 05/21/2017   GLUCOSE 94 05/21/2017   BUN 15 05/21/2017   CREATININE 0.81 05/21/2017   BILITOT 0.7 05/21/2017   ALKPHOS 56 05/21/2017   AST 19 05/21/2017   ALT 21 05/21/2017   PROT 7.2 05/21/2017   ALBUMIN 4.2 05/21/2017   CALCIUM 9.4 05/21/2017   GFR 101.54 05/21/2017   Lab Results  Component Value Date   CHOL 207 (H) 05/21/2017   Lab Results  Component Value Date   HDL 84.30 05/21/2017   Lab Results  Component Value Date   LDLCALC 114 (H) 05/21/2017   Lab Results  Component Value Date   TRIG 42.0 05/21/2017   Lab Results  Component Value Date   CHOLHDL 2 05/21/2017   No results found for: HGBA1C    Assessment & Plan:   Problem List Items Addressed This Visit      Digestive   Slow transit constipation   Relevant Medications   psyllium (METAMUCIL SMOOTH TEXTURE) 58.6 %  powder     Nervous and Auditory   Excessive cerumen in left ear canal   Relevant Medications   Carbamide Peroxide-Saline (EAR WAX CLEANSING) 6.5 % KIT     Other   Healthcare maintenance - Primary   Relevant Orders   CBC   Comp Met (CMET)   Lipid Profile   Urinalysis, Routine w reflex microscopic   PSA      Meds ordered this encounter  Medications  . Carbamide Peroxide-Saline (EAR WAX CLEANSING) 6.5 % KIT    Sig: Per kit.    Dispense:  1 kit    Refill:  2  . psyllium (METAMUCIL SMOOTH TEXTURE) 58.6 % powder    Sig: Take 1 packet by mouth 3 (three) times daily.    Dispense:  283 g    Refill:  12    Follow-up: Return in about 1 year (around 01/25/2020), or if symptoms worsen or fail to improve.   Follow-up as needed information was given on health maintenance and disease prevention.  Patient refuses recommended flu vaccine.

## 2019-01-25 NOTE — Patient Instructions (Signed)
Health Maintenance After Age 67 After age 39, you are at a higher risk for certain long-term diseases and infections as well as injuries from falls. Falls are a major cause of broken bones and head injuries in people who are older than age 66. Getting regular preventive care can help to keep you healthy and well. Preventive care includes getting regular testing and making lifestyle changes as recommended by your health care provider. Talk with your health care provider about:  Which screenings and tests you should have. A screening is a test that checks for a disease when you have no symptoms.  A diet and exercise plan that is right for you. What should I know about screenings and tests to prevent falls? Screening and testing are the best ways to find a health problem early. Early diagnosis and treatment give you the best chance of managing medical conditions that are common after age 8. Certain conditions and lifestyle choices may make you more likely to have a fall. Your health care provider may recommend:  Regular vision checks. Poor vision and conditions such as cataracts can make you more likely to have a fall. If you wear glasses, make sure to get your prescription updated if your vision changes.  Medicine review. Work with your health care provider to regularly review all of the medicines you are taking, including over-the-counter medicines. Ask your health care provider about any side effects that may make you more likely to have a fall. Tell your health care provider if any medicines that you take make you feel dizzy or sleepy.  Osteoporosis screening. Osteoporosis is a condition that causes the bones to get weaker. This can make the bones weak and cause them to break more easily.  Blood pressure screening. Blood pressure changes and medicines to control blood pressure can make you feel dizzy.  Strength and balance checks. Your health care provider may recommend certain tests to check your  strength and balance while standing, walking, or changing positions.  Foot health exam. Foot pain and numbness, as well as not wearing proper footwear, can make you more likely to have a fall.  Depression screening. You may be more likely to have a fall if you have a fear of falling, feel emotionally low, or feel unable to do activities that you used to do.  Alcohol use screening. Using too much alcohol can affect your balance and may make you more likely to have a fall. What actions can I take to lower my risk of falls? General instructions  Talk with your health care provider about your risks for falling. Tell your health care provider if: ? You fall. Be sure to tell your health care provider about all falls, even ones that seem minor. ? You feel dizzy, sleepy, or off-balance.  Take over-the-counter and prescription medicines only as told by your health care provider. These include any supplements.  Eat a healthy diet and maintain a healthy weight. A healthy diet includes low-fat dairy products, low-fat (lean) meats, and fiber from whole grains, beans, and lots of fruits and vegetables. Home safety  Remove any tripping hazards, such as rugs, cords, and clutter.  Install safety equipment such as grab bars in bathrooms and safety rails on stairs.  Keep rooms and walkways well-lit. Activity   Follow a regular exercise program to stay fit. This will help you maintain your balance. Ask your health care provider what types of exercise are appropriate for you.  If you need a cane or  walker, use it as recommended by your health care provider.  Wear supportive shoes that have nonskid soles. Lifestyle  Do not drink alcohol if your health care provider tells you not to drink.  If you drink alcohol, limit how much you have: ? 0-1 drink a day for women. ? 0-2 drinks a day for men.  Be aware of how much alcohol is in your drink. In the [place] one drink equals one typical bottle of beer (12  oz), one-half glass of wine (5 oz), or one shot of hard liquor (1 oz).  Do not use any products that contain nicotine or tobacco, such as cigarettes and e-cigarettes. If you need help quitting, ask your health care provider. Summary  Having a healthy lifestyle and getting preventive care can help to protect your health and wellness after age 65.  Screening and testing are the best way to find a health problem early and help you avoid having a fall. Early diagnosis and treatment give you the best chance for managing medical conditions that are more common for people who are older than age 65.  Falls are a major cause of broken bones and head injuries in people who are older than age 65. Take precautions to prevent a fall at home.  Work with your health care provider to learn what changes you can make to improve your health and wellness and to prevent falls. This information is not intended to replace advice given to you by your health care provider. Make sure you discuss any questions you have with your health care provider. Document Released: 12/25/2016 Document Revised: 06/04/2018 Document Reviewed: 12/25/2016 Elsevier Patient Education  2020 [employer]  Preventive Care 65 Years and Older, Male Preventive care refers to lifestyle choices and visits with your health care provider that can promote health and wellness. This includes:  A yearly physical exam. This is also called an annual well check.  Regular dental and eye exams.  Immunizations.  Screening for certain conditions.  Healthy lifestyle choices, such as diet and exercise. What can I expect for my preventive care visit? Physical exam Your health care provider will check:  Height and weight. These may be used to calculate body mass index (BMI), which is a measurement that tells if you are at a healthy weight.  Heart rate and blood pressure.  Your skin for abnormal spots. Counseling Your health care provider may ask  you questions about:  Alcohol, tobacco, and drug use.  Emotional well-being.  Home and relationship well-being.  Sexual activity.  Eating habits.  History of falls.  Memory and ability to understand (cognition).  Work and work environment. What immunizations do I need?  Influenza (flu) vaccine  This is recommended every year. Tetanus, diphtheria, and pertussis (Tdap) vaccine  You may need a Td booster every 10 years. Varicella (chickenpox) vaccine  You may need this vaccine if you have not already been vaccinated. Zoster (shingles) vaccine  You may need this after age 60. Pneumococcal conjugate (PCV13) vaccine  One dose is recommended after age 65. Pneumococcal polysaccharide (PPSV23) vaccine  One dose is recommended after age 65. Measles, mumps, and rubella (MMR) vaccine  You may need at least one dose of MMR if you were born in 1957 or later. You may also need a second dose. Meningococcal conjugate (MenACWY) vaccine  You may need this if you have certain conditions. Hepatitis A vaccine  You may need this if you have certain conditions or if you travel or work   in places where you may be exposed to hepatitis A. Hepatitis B vaccine  You may need this if you have certain conditions or if you travel or work in places where you may be exposed to hepatitis B. Haemophilus influenzae type b (Hib) vaccine  You may need this if you have certain conditions. You may receive vaccines as individual doses or as more than one vaccine together in one shot (combination vaccines). Talk with your health care provider about the risks and benefits of combination vaccines. What tests do I need? Blood tests  Lipid and cholesterol levels. These may be checked every 5 years, or more frequently depending on your overall health.  Hepatitis C test.  Hepatitis B test. Screening  Lung cancer screening. You may have this screening every year starting at age 33 if you have a  30-pack-year history of smoking and currently smoke or have quit within the past 15 years.  Colorectal cancer screening. All adults should have this screening starting at age 23 and continuing until age 65. Your health care provider may recommend screening at age 85 if you are at increased risk. You will have tests every 1-10 years, depending on your results and the type of screening test.  Prostate cancer screening. Recommendations will vary depending on your family history and other risks.  Diabetes screening. This is done by checking your blood sugar (glucose) after you have not eaten for a while (fasting). You may have this done every 1-3 years.  Abdominal aortic aneurysm (AAA) screening. You may need this if you are a current or former smoker.  Sexually transmitted disease (STD) testing. Follow these instructions at home: Eating and drinking  Eat a diet that includes fresh fruits and vegetables, whole grains, lean protein, and low-fat dairy products. Limit your intake of foods with high amounts of sugar, saturated fats, and salt.  Take vitamin and mineral supplements as recommended by your health care provider.  Do not drink alcohol if your health care provider tells you not to drink.  If you drink alcohol: ? Limit how much you have to 0-2 drinks a day. ? Be aware of how much alcohol is in your drink. In the [place] one drink equals one 12 oz bottle of beer (355 mL), one 5 oz glass of wine (148 mL), or one 1 oz glass of hard liquor (44 mL). Lifestyle  Take daily care of your teeth and gums.  Stay active. Exercise for at least 30 minutes on 5 or more days each week.  Do not use any products that contain nicotine or tobacco, such as cigarettes, e-cigarettes, and chewing tobacco. If you need help quitting, ask your health care provider.  If you are sexually active, practice safe sex. Use a condom or other form of protection to prevent STIs (sexually transmitted infections).  Talk  with your health care provider about taking a low-dose aspirin or statin. What's next?  Visit your health care provider once a year for a well check visit.  Ask your health care provider how often you should have your eyes and teeth checked.  Stay up to date on all vaccines. This information is not intended to replace advice given to you by your health care provider. Make sure you discuss any questions you have with your health care provider. Document Released: 03/10/2015 Document Revised: 02/05/2018 Document Reviewed: 02/05/2018 Elsevier Patient Education  2020 [employer].

## 2019-02-08 ENCOUNTER — Telehealth: Payer: Medicare HMO | Admitting: Family

## 2019-02-08 DIAGNOSIS — Z20822 Contact with and (suspected) exposure to covid-19: Secondary | ICD-10-CM

## 2019-02-08 DIAGNOSIS — Z20828 Contact with and (suspected) exposure to other viral communicable diseases: Secondary | ICD-10-CM

## 2019-02-08 MED ORDER — BENZONATATE 100 MG PO CAPS: 100.0000 mg | ORAL_CAPSULE | Freq: Three times a day (TID) | ORAL | 0 refills | Status: DC | PRN

## 2019-02-08 NOTE — Progress Notes (Signed)
E-Visit for Corona Virus Screening   Your current symptoms could be consistent with the coronavirus.  Many health care providers can now test patients at their office but not all are.  [facility] has multiple testing sites. For information on our COVID testing locations and hours go to [email or web address]   Testing Information: The COVID-19 Community Testing sites will begin testing BY APPOINTMENT ONLY starting the week of December 15th and December 16th (see go-live dates by location below).  You can begin scheduling online at [email or web address]  If you do not have access to a smart phone or computer you may call [phone] for an appointment. . In addition, starting the week of December 14th we will move INDOORS for testing (see locations below). . Community testing site appointment hours will be 8 a.m. to 3:30 p.m.   Testing Locations: [facility] will begin Tuesday December 15th. Closed on Monday December 14th to relocate indoors to [address], [place] [address] [facility] Appointments will begin Wednesday December 16th. Closed on Tuesday December 15th for move to indoors at [place]. [address], [place], [place] [address] [facility] Appointments will begin Wednesday December 16th. Closed on Tuesday December 15th for move to indoors at [address] [place] [address]   We are enrolling you in our [place] [facility] for [place] . Daily you will receive a questionnaire within the [facility] website. Our COVID 19 response team will be monitoring your responses daily.  Please quarantine yourself while awaiting your test results. If you develop fever/cough/breathlessness, please stay home for 10 days with improving symptoms and until you have had 24 hours of no fever (without taking a fever reducer).  You should wear a mask or cloth face covering over your nose and mouth if you must be around other people or animals, including pets (even at  home). Try to stay at least 6 feet away from other people. This will protect the people around you.  Please continue good preventive care measures, including:  frequent hand-washing, avoid touching your face, cover coughs/sneezes, stay out of crowds and keep a 6 foot distance from others.  COVID-19 is a respiratory illness with symptoms that are similar to the flu. Symptoms are typically mild to moderate, but there have been cases of severe illness and death due to the virus.   The following symptoms may appear 2-14 days after exposure: . Fever . Cough . Shortness of breath or difficulty breathing . Chills . Repeated shaking with chills . Muscle pain . Headache . Sore throat . New loss of taste or smell . Fatigue . Congestion or runny nose . Nausea or vomiting . Diarrhea  Go to the nearest hospital ED for assessment if fever/cough/breathlessness are severe or illness seems like a threat to life.  It is vitally important that if you feel that you have an infection such as this virus or any other virus that you stay home and away from places where you may spread it to others.  You should avoid contact with people age 8 and older.   You can use medication such as A prescription cough medication called Tessalon Perles 100 mg. You may take 1-2 capsules every 8 hours as needed for cough  You may also take acetaminophen (Tylenol) as needed for fever.  Reduce your risk of any infection by using the same precautions used for avoiding the common cold or flu:  Marland Kitchen Wash your hands often with soap and warm water for  at least 20 seconds.  If soap and water are not readily available, use an alcohol-based hand sanitizer with at least 60% alcohol.  . If coughing or sneezing, cover your mouth and nose by coughing or sneezing into the elbow areas of your shirt or coat, into a tissue or into your sleeve (not your hands). . Avoid shaking hands with others and consider head nods or verbal greetings only. . Avoid  touching your eyes, nose, or mouth with unwashed hands.  . Avoid close contact with people who are sick. . Avoid places or events with large numbers of people in one location, like concerts or sporting events. . Carefully consider travel plans you have or are making. . If you are planning any travel outside or inside the [place], visit the CDC's Travelers' Health webpage for the latest health notices. . If you have some symptoms but not all symptoms, continue to monitor at home and seek medical attention if your symptoms worsen. . If you are having a medical emergency, call 911.  HOME CARE . Only take medications as instructed by your medical team. . Drink plenty of fluids and get plenty of rest. . A steam or ultrasonic humidifier can help if you have congestion.   GET HELP RIGHT AWAY IF YOU HAVE EMERGENCY WARNING SIGNS** FOR COVID-19. If you or someone is showing any of these signs seek emergency medical care immediately. Call 911 or proceed to your closest emergency facility if: . You develop worsening high fever. . Trouble breathing . Bluish lips or face . Persistent pain or pressure in the chest . New confusion . Inability to wake or stay awake . You cough up blood. . Your symptoms become more severe  **This list is not all possible symptoms. Contact your medical provider for any symptoms that are sever or concerning to you.  MAKE SURE YOU   Understand these instructions.  Will watch your condition.  Will get help right away if you are not doing well or get worse.  Your e-visit answers were reviewed by a board certified advanced clinical practitioner to complete your personal care plan.  Depending on the condition, your plan could have included both over the counter or prescription medications.  If there is a problem please reply once you have received a response from your provider.  Your safety is important to [place].  If you have drug allergies check your prescription carefully.     You can use MyChart to ask questions about today's visit, request a non-urgent call back, or ask for a work or school excuse for 24 hours related to this e-Visit. If it has been greater than 24 hours you will need to follow up with your provider, or enter a new e-Visit to address those concerns. You will get an e-mail in the next two days asking about your experience.  I hope that your e-visit has been valuable and will speed your recovery. Thank you for using e-visits.  Approximately 5 minutes was spent documenting and reviewing patient's chart.

## 2019-02-10 ENCOUNTER — Ambulatory Visit: Payer: Medicare HMO | Attending: Internal Medicine

## 2019-02-10 ENCOUNTER — Other Ambulatory Visit: Payer: Self-pay

## 2019-02-10 DIAGNOSIS — Z20828 Contact with and (suspected) exposure to other viral communicable diseases: Secondary | ICD-10-CM | POA: Diagnosis not present

## 2019-02-10 DIAGNOSIS — Z20822 Contact with and (suspected) exposure to covid-19: Secondary | ICD-10-CM

## 2019-02-12 ENCOUNTER — Other Ambulatory Visit: Payer: Self-pay | Admitting: *Deleted

## 2019-02-12 DIAGNOSIS — L301 Dyshidrosis [pompholyx]: Secondary | ICD-10-CM

## 2019-02-12 DIAGNOSIS — N529 Male erectile dysfunction, unspecified: Secondary | ICD-10-CM

## 2019-02-12 LAB — NOVEL CORONAVIRUS, NAA: SARS-CoV-2, NAA: DETECTED — AB

## 2019-02-12 NOTE — Telephone Encounter (Signed)
Advise him to start either flonase or nasonex. Follow up with me for a virtual next week if not improving.

## 2019-02-12 NOTE — Telephone Encounter (Signed)
Pt stated he used that twice and he is better now but he was wondering if Dr. Ethelene Hal can send him some prednisone for future use if he is sick. I advised the pt that the doctor do not send in this med unless the pt is sick and after evaluation the need for this med, but pt still request for [place] to ask Dr. Keane Police. Ethelene Hal please advise?    Pt also request Cialis and Triamcinolone cream (taking for eczema) send in to [facility]. Dr. Ethelene Hal Cialis have not send in since 05/11/2016 and Triamcinolone discontinued back in 01/25/2019. Pt last CPE with you were 01/25/2019. Ok to send in these meds and what quantity? Please advise

## 2019-02-12 NOTE — Telephone Encounter (Signed)
Dr. Ethelene Hal please advise, offer virtual visit with you first?

## 2019-02-12 NOTE — Telephone Encounter (Signed)
Pt called to request a refill on his prednisone. This medication is not listed on his med list. He stated that he takes it for ongoing sinus infection. He does not have one at the moment, but he thought he did . But was tested for covid on 12/16 and was positive. He took Nyquil one night and it helped his sinus congestion. He denies symptoms at this time but he did have a fever and chills on Sunday.

## 2019-02-13 ENCOUNTER — Telehealth: Payer: Self-pay | Admitting: Unknown Physician Specialty

## 2019-02-13 NOTE — Telephone Encounter (Signed)
Called to discuss with patient about Covid symptoms and the use of bamlanivimab, a monoclonal antibody infusion for those with mild to moderate Covid symptoms and at a high risk of hospitalization.  Pt is qualified for this infusion at the [facility] due to Age > 65   Message left to call back  

## 2019-02-13 NOTE — Telephone Encounter (Signed)
Discussed with patient about Covid symptoms and the use of bamlanivimab, a monoclonal antibody infusion for those with mild to moderate Covid symptoms and at a high risk of hospitalization.  Pt has sinus symptoms but does not believe he has Covid.  Discussed 10 day quarantine from the day of the test.

## 2019-02-15 MED ORDER — TRIAMCINOLONE ACETONIDE 0.025 % EX OINT: TOPICAL_OINTMENT | CUTANEOUS | 0 refills | Status: AC

## 2019-02-15 MED ORDER — TADALAFIL 20 MG PO TABS: 20.0000 mg | ORAL_TABLET | Freq: Every day | ORAL | 11 refills | Status: DC | PRN

## 2019-02-15 NOTE — Addendum Note (Signed)
Addended by: Abelino Derrick A on: 02/15/2019 09:00 AM   Modules accepted: Orders, Level of Service

## 2019-02-16 NOTE — Telephone Encounter (Signed)
Left detail message inform the pt that Cialis and kenalog cream sent in  but not prednison-- pt needs to request during time of illness.

## 2019-06-03 DIAGNOSIS — L578 Other skin changes due to chronic exposure to nonionizing radiation: Secondary | ICD-10-CM | POA: Diagnosis not present

## 2019-06-03 DIAGNOSIS — L309 Dermatitis, unspecified: Secondary | ICD-10-CM | POA: Diagnosis not present

## 2019-08-10 ENCOUNTER — Ambulatory Visit: Payer: Medicare HMO | Admitting: Orthopaedic Surgery

## 2019-08-10 ENCOUNTER — Encounter: Payer: Self-pay | Admitting: Orthopaedic Surgery

## 2019-08-10 ENCOUNTER — Other Ambulatory Visit: Payer: Self-pay

## 2019-08-10 ENCOUNTER — Ambulatory Visit: Payer: Self-pay

## 2019-08-10 VITALS — Ht 71.0 in | Wt 185.0 lb

## 2019-08-10 DIAGNOSIS — M25562 Pain in left knee: Secondary | ICD-10-CM | POA: Diagnosis not present

## 2019-08-10 NOTE — Progress Notes (Signed)
Office Visit Note   Patient: Evan Parker.           Date of Birth: [birth year]           MRN: [id number] Visit Date: 08/10/2019              Requested by: Libby Maw, MD [address] [place],  [place] [address] PCP: Libby Maw, MD   Assessment & Plan: Visit Diagnoses:  1. Acute pain of left knee     Plan: Mr. Hoying has a painful left knee as a result of striking the medial aspect of his knee against a trailer hitch 2 weeks ago.  He is significantly better.  X-rays were nondiagnostic.  He does have pain along the medial compartment with slight narrowing of the joint space.  He could have an exacerbation of pre-existing arthritis or just a bone bruise.  I do not think he has a meniscal tear based on the mechanism of injury.  As he is improving I would suggest waiting another 3 to 4 weeks and then consider an MRI scan if still having compromise  Follow-Up Instructions: Return if symptoms worsen or fail to improve.   Orders:  Orders Placed This Encounter  Procedures  . XR KNEE 3 VIEW LEFT   No orders of the defined types were placed in this encounter.     Procedures: No procedures performed   Clinical Data: No additional findings.   Subjective: Chief Complaint  Patient presents with  . Left Knee - Pain  Patient presents today for left knee pain. He was pouring concrete two weeks ago. He was trying to hurry and hit his knee on the trailer hitch. He hit the medial side of his knee. It was swollen initially, but that has improved. He has difficulty sleeping on his side because it hurts for his needs to touch. Sometimes the pain feels like a burning sensation, but other times it feels like a needle is in his knee. He is not taking anything for pain.  He notes that he is definitely better over the last several days and was not having much trouble with his knee prior to the injury  HPI  Review of Systems  Constitutional: Positive for fatigue.    HENT: Negative for ear pain.   Eyes: Negative for pain.  Respiratory: Negative for shortness of breath.   Cardiovascular: Negative for leg swelling.  Gastrointestinal: Negative for constipation and diarrhea.  Endocrine: Negative for cold intolerance and heat intolerance.  Genitourinary: Negative for difficulty urinating.  Musculoskeletal: Negative for joint swelling.  Skin: Negative for rash.  Allergic/Immunologic: Negative for food allergies.  Neurological: Negative for weakness.  Hematological: Does not bruise/bleed easily.  Psychiatric/Behavioral: Positive for sleep disturbance.     Objective: Vital Signs: Ht 5\' 11"  (1.803 m)   Wt 185 lb (83.9 kg)   BMI 25.80 kg/m   Physical Exam Constitutional:      Appearance: He is well-developed.  Eyes:     Pupils: Pupils are equal, round, and reactive to light.  Pulmonary:     Effort: Pulmonary effort is normal.  Skin:    General: Skin is warm and dry.  Neurological:     Mental Status: He is alert and oriented to person, place, and time.  Psychiatric:        Behavior: Behavior normal.     Ortho Exam left knee was not hot red warm or swollen.  No effusion.  Does have some  medial joint pain more posteriorly than anteriorly.  No pain over the MCL or the proximal tibia.  No skin changes.  No effusion.  No popliteal pain.  No lateral pain.  No patella discomfort.  Negative anterior drawer sign Specialty Comments:  No specialty comments available.  Imaging: XR KNEE 3 VIEW LEFT  Result Date: 08/10/2019 Films of the left knee obtained in 3 projections standing.  There is some narrowing of the medial joint space but no evidence of any acute change or ectopic calcification.  Patient had a direct blow to the medial aspect of his knee 2 weeks ago    PMFS History: Patient Active Problem List   Diagnosis Date Noted  . Pain in left knee 08/10/2019  . Excessive cerumen in left ear canal 01/25/2019  . Slow transit constipation  01/25/2019  . Right hip pain 05/21/2017  . Keratosis, actinic 06/05/2016  . Healthcare maintenance 05/21/2016  . Allergic rhinitis 04/27/2009  . 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 GERD 10/21/2006   Past Medical History:  Diagnosis Date  . ALLERGIC RHINITIS 04/27/2009  . Allergy   . ASTHMA, ACUTE 03/02/2010  . CHEST PAIN 04/27/2009  . CLOSED DISLOCATION OF ACROMIOCLAVICULAR 11/30/2007  . 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 GERD 10/21/2006    Family History  Problem Relation Age of Onset  . Hyperlipidemia Other   . Colon cancer Neg Hx   . Esophageal cancer Neg Hx   . Stomach cancer Neg Hx     Past Surgical History:  Procedure Laterality Date  . COLONOSCOPY  2006  . EYE SURGERY     foreign body R eye  . SHOULDER SURGERY Right 01/2013  . TONSILLECTOMY AND ADENOIDECTOMY     Social History   Occupational History  . Not on file  Tobacco Use  . Smoking status: Never Smoker  . Smokeless tobacco: Former Systems developer    Types: Secondary school teacher  . Vaping Use: Never used  Substance and Sexual Activity  . Alcohol use: Yes    Alcohol/week: 7.0 standard drinks    Types: 7 Cans of beer per week  . Drug use: Not Currently  . Sexual activity: Yes

## 2019-08-19 ENCOUNTER — Other Ambulatory Visit: Payer: Self-pay | Admitting: Orthopaedic Surgery

## 2019-08-19 ENCOUNTER — Other Ambulatory Visit: Payer: Self-pay

## 2019-08-19 ENCOUNTER — Telehealth: Payer: Self-pay | Admitting: Orthopaedic Surgery

## 2019-08-19 DIAGNOSIS — Z77018 Contact with and (suspected) exposure to other hazardous metals: Secondary | ICD-10-CM

## 2019-08-19 DIAGNOSIS — M25562 Pain in left knee: Secondary | ICD-10-CM

## 2019-08-19 NOTE — Telephone Encounter (Signed)
Please advise 

## 2019-08-19 NOTE — Telephone Encounter (Signed)
Called patient. No answer. Left message that MRI has been ordered and he needs to schedule follow up appointment for a couple days after MRI has been performed to get results.

## 2019-08-19 NOTE — Telephone Encounter (Signed)
Patient's wife called stating that his knee is not any better and would like to proceed with the MRI.  CB#[phone].  Thank you.

## 2019-08-19 NOTE — Telephone Encounter (Signed)
Ok to schedule.

## 2019-08-24 ENCOUNTER — Telehealth: Payer: Self-pay

## 2019-08-24 NOTE — Telephone Encounter (Signed)
Patient called stating that he has an MRI appointment in late July and wanted to know if MRI can be done at the hospital.  Talked with Gabriel Cirri, who advised me that she will contact the hospital.

## 2019-09-03 ENCOUNTER — Ambulatory Visit (HOSPITAL_COMMUNITY)
Admission: RE | Admit: 2019-09-03 | Discharge: 2019-09-03 | Disposition: A | Discharge status: Home / Self Care | Payer: Medicare HMO | Source: Ambulatory Visit | Attending: Orthopaedic Surgery | Admitting: Orthopaedic Surgery

## 2019-09-03 DIAGNOSIS — Z135 Encounter for screening for eye and ear disorders: Secondary | ICD-10-CM | POA: Diagnosis not present

## 2019-09-03 DIAGNOSIS — Z77018 Contact with and (suspected) exposure to other hazardous metals: Secondary | ICD-10-CM

## 2019-09-03 DIAGNOSIS — M25562 Pain in left knee: Secondary | ICD-10-CM | POA: Diagnosis not present

## 2019-09-03 DIAGNOSIS — Z01818 Encounter for other preprocedural examination: Secondary | ICD-10-CM | POA: Diagnosis not present

## 2019-09-10 ENCOUNTER — Other Ambulatory Visit: Payer: Self-pay

## 2019-09-10 ENCOUNTER — Ambulatory Visit: Payer: Medicare HMO | Admitting: Orthopaedic Surgery

## 2019-09-10 ENCOUNTER — Encounter: Payer: Self-pay | Admitting: Orthopaedic Surgery

## 2019-09-10 DIAGNOSIS — M25562 Pain in left knee: Secondary | ICD-10-CM

## 2019-09-10 DIAGNOSIS — G8929 Other chronic pain: Secondary | ICD-10-CM

## 2019-09-10 NOTE — Progress Notes (Signed)
Office Visit Note   Patient: Evan Parker.           Date of Birth: 09-26-1950           MRN: [id number] Visit Date: 09/10/2019              Requested by: Libby Maw, MD [address] [place],   [address] PCP: Libby Maw, MD   Assessment & Plan: Visit Diagnoses:  1. Chronic pain of left knee     Plan: Mr. Whitenack had an MRI scan of his left knee demonstrating a large radial tear of the body of the medial meniscus extending to the posterior horn body junction.  There was an oblique tear of the posterior horn of the medial meniscus extending to the inferior articular surface and a 2.6 mm posterior peripheral parameniscal cyst.  There was degeneration of the root of the posterior horn with a 3 mm anterior meniscal cyst.  There was mild partial-thickness cartilage loss of the medial compartment and mild partial thickness loss of the lateral patella facet.  Mild edema in the superior Hoffa's fat pad.  Long discussion regarding the above.  I would suggest an arthroscopic debridement of the medial meniscus.  He has had trouble for months and I do not think this will resolve on its own.  He has some work that he needs to complete and will think about it over the next several weeks.  We will give him Debbie's card and he can call her at any time to schedule the surgery.  I discussed the surgery with him including the outpatient nature of the incision what he may expect postoperatively in terms of pain and time out of work  Follow-Up Instructions: Return if symptoms worsen or fail to improve.   Orders:  No orders of the defined types were placed in this encounter.  No orders of the defined types were placed in this encounter.     Procedures: No procedures performed   Clinical Data: No additional findings.   Subjective: Chief Complaint  Patient presents with  . Left Knee - Pain  No change in symptoms left knee.  MRI scan as above.  Still having  difficulty with deep knee bends and squats and twisting with predominately medial joint pain.  Has had trouble sleeping at night because of pain and difficulty finding a comfortable position.  Has had some trouble on a chronic basis but it seems to be worse since he struck the medial aspect of his knee against firm object several months ago  HPI  Review of Systems   Objective: Vital Signs: There were no vitals taken for this visit.  Physical Exam Constitutional:      Appearance: He is well-developed.  Eyes:     Pupils: Pupils are equal, round, and reactive to light.  Pulmonary:     Effort: Pulmonary effort is normal.  Skin:    General: Skin is warm and dry.  Neurological:     Mental Status: He is alert and oriented to person, place, and time.  Psychiatric:        Behavior: Behavior normal.     Ortho Exam awake alert and oriented x3.  Comfortable sitting.  Has pain directly on the posterior medial joint line.  No popping or clicking.  No effusion.  No patella pain or lateral compartment discomfort.  No pain along the anterior medial compartment.  Full knee extension flexed over 105 degrees without instability.  Negative anterior drawer sign.  No popliteal mass.  No distal edema  Specialty Comments:  No specialty comments available.  Imaging: No results found.   PMFS History: Patient Active Problem List   Diagnosis Date Noted  . Pain in left knee 08/10/2019  . Excessive cerumen in left ear canal 01/25/2019  . Slow transit constipation 01/25/2019  . Right hip pain 05/21/2017  . Keratosis, actinic 06/05/2016  . Healthcare maintenance 05/21/2016  . Allergic rhinitis 04/27/2009  . 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 GERD 10/21/2006   Past Medical History:  Diagnosis Date  . ALLERGIC RHINITIS 04/27/2009  . Allergy   . ASTHMA, ACUTE 03/02/2010  . CHEST PAIN 04/27/2009  . CLOSED DISLOCATION OF ACROMIOCLAVICULAR 11/30/2007  . DYSHIDROTIC  ECZEMA 04/22/2008  . 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 GERD 10/21/2006    Family History  Problem Relation Age of Onset  . Hyperlipidemia Other   . Colon cancer Neg Hx   . Esophageal cancer Neg Hx   . Stomach cancer Neg Hx     Past Surgical History:  Procedure Laterality Date  . COLONOSCOPY  2006  . EYE SURGERY     foreign body R eye  . SHOULDER SURGERY Right 01/2013  . TONSILLECTOMY AND ADENOIDECTOMY     Social History   Occupational History  . Not on file  Tobacco Use  . Smoking status: Never Smoker  . Smokeless tobacco: Former Systems developer    Types: Secondary school teacher  . Vaping Use: Never used  Substance and Sexual Activity  . Alcohol use: Yes    Alcohol/week: 7.0 standard drinks    Types: 7 Cans of beer per week  . Drug use: Not Currently  . Sexual activity: Yes     Garald Balding, MD   Note - This record has been created using [employer].  Chart creation errors have been sought, but may not always  have been located. Such creation errors do not reflect on  the standard of medical care.

## 2019-09-23 ENCOUNTER — Other Ambulatory Visit: Payer: Medicare HMO

## 2019-09-23 ENCOUNTER — Encounter: Payer: Self-pay | Admitting: Orthopaedic Surgery

## 2019-09-23 ENCOUNTER — Other Ambulatory Visit: Payer: Self-pay | Admitting: Orthopedic Surgery

## 2019-09-23 DIAGNOSIS — M23222 Derangement of posterior horn of medial meniscus due to old tear or injury, left knee: Secondary | ICD-10-CM | POA: Diagnosis not present

## 2019-09-23 DIAGNOSIS — M794 Hypertrophy of (infrapatellar) fat pad: Secondary | ICD-10-CM | POA: Diagnosis not present

## 2019-09-23 DIAGNOSIS — M6752 Plica syndrome, left knee: Secondary | ICD-10-CM | POA: Diagnosis not present

## 2019-09-23 DIAGNOSIS — M94262 Chondromalacia, left knee: Secondary | ICD-10-CM | POA: Diagnosis not present

## 2019-09-23 DIAGNOSIS — G8918 Other acute postprocedural pain: Secondary | ICD-10-CM | POA: Diagnosis not present

## 2019-09-23 DIAGNOSIS — M1712 Unilateral primary osteoarthritis, left knee: Secondary | ICD-10-CM | POA: Diagnosis not present

## 2019-09-23 DIAGNOSIS — S83232A Complex tear of medial meniscus, current injury, left knee, initial encounter: Secondary | ICD-10-CM | POA: Diagnosis not present

## 2019-09-23 MED ORDER — OXYCODONE HCL 5 MG PO TABS: 5.0000 mg | ORAL_TABLET | Freq: Four times a day (QID) | ORAL | 0 refills | Status: DC | PRN

## 2019-09-30 ENCOUNTER — Ambulatory Visit: Payer: Medicare HMO | Admitting: Orthopaedic Surgery

## 2019-09-30 ENCOUNTER — Other Ambulatory Visit: Payer: Self-pay

## 2019-09-30 ENCOUNTER — Encounter: Payer: Self-pay | Admitting: Orthopaedic Surgery

## 2019-09-30 VITALS — Ht 71.0 in | Wt 185.0 lb

## 2019-09-30 DIAGNOSIS — M25562 Pain in left knee: Secondary | ICD-10-CM

## 2019-09-30 DIAGNOSIS — G8929 Other chronic pain: Secondary | ICD-10-CM

## 2019-09-30 NOTE — Progress Notes (Signed)
Office Visit Note   Patient: Evan Parker.           Date of Birth: [birth year]           MRN: [id number] Visit Date: 09/30/2019              Requested by: Libby Maw, MD [address] [place],  [place] [address] PCP: Libby Maw, MD   Assessment & Plan: Visit Diagnoses:  1. Chronic pain of left knee     Plan: 1 week status post left knee arthroscopy.  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  Doing well.  Does have a small effusion but would like to wait a little longer before he considers aspiration.  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  We will check him back in a week  Follow-Up Instructions: Return in about 1 week (around 10/07/2019).   Orders:  No orders of the defined types were placed in this encounter.  No orders of the defined types were placed in this encounter.     Procedures: No procedures performed   Clinical Data: No additional findings.   Subjective: Chief Complaint  Patient presents with  . Left Knee - Follow-up    Left knee scope 09/23/2019  Patient presents today for follow up on his left knee. He had a left knee arthroscopy on 09/23/2019. He is now one week out from surgery. Patient states that he had quite a bit of pain the first few days after surgery, but now is doing much better. He is not needing to take anything for pain.  Not using any ambulatory aid.  HPI  Review of Systems   Objective: Vital Signs: Ht 5\' 11"  (1.803 m)   Wt 185 lb (83.9 kg)   BMI 25.80 kg/m   Physical Exam  Ortho Exam left knee with small effusion.  The knee was not hot red or swollen.  The 2 arthroscopic portals are healing without problems.  No calf pain.  No distal edema.  Still has some mild medial joint pain but none laterally.  No  popliteal pain  Specialty Comments:  No specialty comments available.  Imaging: No results found.   PMFS History: Patient Active Problem List   Diagnosis Date Noted  . Pain in left knee 08/10/2019  . Excessive cerumen in left ear canal 01/25/2019  . Slow transit constipation 01/25/2019  . Right hip pain 05/21/2017  . Keratosis, actinic 06/05/2016  . Healthcare maintenance 05/21/2016  . Allergic rhinitis 04/27/2009  . 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 GERD 10/21/2006   Past Medical History:  Diagnosis Date  . ALLERGIC RHINITIS 04/27/2009  . Allergy   . ASTHMA, ACUTE 03/02/2010  . CHEST PAIN 04/27/2009  . CLOSED DISLOCATION OF ACROMIOCLAVICULAR 11/30/2007  . 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 GERD 10/21/2006    Family History  Problem Relation Age of Onset  . Hyperlipidemia Other   . Colon cancer Neg Hx   . Esophageal cancer Neg Hx   . Stomach cancer Neg Hx     Past Surgical History:  Procedure Laterality Date  . COLONOSCOPY  2006  . EYE SURGERY     foreign body R eye  . SHOULDER SURGERY  Right 01/2013  . TONSILLECTOMY AND ADENOIDECTOMY     Social History   Occupational History  . Not on file  Tobacco Use  . Smoking status: Never Smoker  . Smokeless tobacco: Former Systems developer    Types: Secondary school teacher  . Vaping Use: Never used  Substance and Sexual Activity  . Alcohol use: Yes    Alcohol/week: 7.0 standard drinks    Types: 7 Cans of beer per week  . Drug use: Not Currently  . Sexual activity: Yes

## 2019-10-07 ENCOUNTER — Encounter: Payer: Self-pay | Admitting: Orthopaedic Surgery

## 2019-10-07 ENCOUNTER — Other Ambulatory Visit: Payer: Self-pay

## 2019-10-07 ENCOUNTER — Ambulatory Visit: Payer: Medicare HMO | Admitting: Orthopaedic Surgery

## 2019-10-07 VITALS — Ht 71.0 in | Wt 185.0 lb

## 2019-10-07 DIAGNOSIS — M25562 Pain in left knee: Secondary | ICD-10-CM | POA: Diagnosis not present

## 2019-10-07 DIAGNOSIS — G8929 Other chronic pain: Secondary | ICD-10-CM

## 2019-10-07 MED ORDER — LIDOCAINE HCL 1 % IJ SOLN
2.0000 mL | INTRAMUSCULAR | Status: AC | PRN
  Administered 2019-10-07: 2 mL

## 2019-10-07 NOTE — Progress Notes (Signed)
Office Visit Note   Patient: Evan Parker.           Date of Birth: 1951/01/22           MRN: [id number] Visit Date: 10/07/2019              Requested by: Libby Maw, MD [address] [place],  [place] [address] PCP: Libby Maw, MD   Assessment & Plan: Visit Diagnoses:  1. Chronic pain of left knee     Plan: Several week status post left knee arthroscopy.  Still having a little bit of "tightness".  I aspirated the knee of 53 cc of fluid he felt much better.  No evidence of infection or DVT.  Like to see him back in 2 weeks.  Increase activities as he tolerates  Follow-Up Instructions: Return in about 2 weeks (around 10/21/2019).   Orders:  Orders Placed This Encounter  Procedures  . Large Joint Inj: L knee   No orders of the defined types were placed in this encounter.     Procedures: Large Joint Inj: L knee on 10/07/2019 4:48 PM Indications: pain and diagnostic evaluation Details: 25 G 1.5 in needle, anteromedial approach  Arthrogram: No  Medications: 2 mL lidocaine 1 % Aspirate: 53 mL serous Outcome: tolerated well, no immediate complications Procedure, treatment alternatives, risks and benefits explained, specific risks discussed. Consent was given by the patient. Patient was prepped and draped in the usual sterile fashion.       Clinical Data: No additional findings.   Subjective: Chief Complaint  Patient presents with  . Left Knee - Follow-up    Left knee scope 09/23/2019  Patient presents today for follow up on his left knee. He had a left knee scope on 09/23/2019 and is now 2 weeks out from surgery. Patient states that he is doing "pretty good". Some swelling. He has been icing. Not taking anything for pain.   HPI  Review of Systems   Objective: Vital Signs: Ht 5\' 11"  (1.803 m)   Wt 185 lb (83.9 kg)   BMI 25.80 kg/m   Physical Exam  Ortho Exam large effusion left knee without any significant tenderness.   Arthroscopic portals of healed without problem.  No popliteal pain or mass.  No calf pain or distal edema.  Lacked a few degrees to full extension but had full extension after aspiration and flexed over 105 degrees  Specialty Comments:  No specialty comments available.  Imaging: No results found.   PMFS History: Patient Active Problem List   Diagnosis Date Noted  . Pain in left knee 08/10/2019  . Excessive cerumen in left ear canal 01/25/2019  . Slow transit constipation 01/25/2019  . Right hip pain 05/21/2017  . Keratosis, actinic 06/05/2016  . Healthcare maintenance 05/21/2016  . Allergic rhinitis 04/27/2009  . 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 GERD 10/21/2006   Past Medical History:  Diagnosis Date  . ALLERGIC RHINITIS 04/27/2009  . Allergy   . ASTHMA, ACUTE 03/02/2010  . CHEST PAIN 04/27/2009  . CLOSED DISLOCATION OF ACROMIOCLAVICULAR 11/30/2007  . 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 GERD 10/21/2006    Family History  Problem Relation Age of Onset  . Hyperlipidemia Other   . Colon cancer Neg Hx   . Esophageal cancer Neg Hx   . Stomach cancer Neg Hx     Past Surgical History:  Procedure Laterality Date  .  COLONOSCOPY  2006  . EYE SURGERY     foreign body R eye  . SHOULDER SURGERY Right 01/2013  . TONSILLECTOMY AND ADENOIDECTOMY     Social History   Occupational History  . Not on file  Tobacco Use  . Smoking status: Never Smoker  . Smokeless tobacco: Former Systems developer    Types: Secondary school teacher  . Vaping Use: Never used  Substance and Sexual Activity  . Alcohol use: Yes    Alcohol/week: 7.0 standard drinks    Types: 7 Cans of beer per week  . Drug use: Not Currently  . Sexual activity: Yes

## 2019-10-27 ENCOUNTER — Ambulatory Visit: Payer: Medicare HMO | Admitting: Orthopaedic Surgery

## 2019-10-27 ENCOUNTER — Encounter: Payer: Self-pay | Admitting: Orthopaedic Surgery

## 2019-10-27 ENCOUNTER — Other Ambulatory Visit: Payer: Self-pay

## 2019-10-27 VITALS — Ht 71.0 in | Wt 185.0 lb

## 2019-10-27 DIAGNOSIS — G8929 Other chronic pain: Secondary | ICD-10-CM

## 2019-10-27 DIAGNOSIS — M25562 Pain in left knee: Secondary | ICD-10-CM

## 2019-10-27 NOTE — Progress Notes (Signed)
Office Visit Note   Patient: Evan Parker.           Date of Birth: 05/30/1950           MRN: [id number] Visit Date: 10/27/2019              Requested by: Libby Maw, MD [address] [place],  [place] [address] PCP: Libby Maw, MD   Assessment & Plan: Visit Diagnoses:  1. Chronic pain of left knee     Plan: This post left knee arthroscopy with a partial medial meniscectomy.  Postop effusion has resolved.  He is back to doing some work and feeling quite good.  Have urged him to work on exercises and range of motion exercises and return as needed.  I think he is done very well and does not have any the pain that he had preoperatively  Follow-Up Instructions: Return if symptoms worsen or fail to improve.   Orders:  No orders of the defined types were placed in this encounter.  No orders of the defined types were placed in this encounter.     Procedures: No procedures performed   Clinical Data: No additional findings.   Subjective: Chief Complaint  Patient presents with  . Left Knee - Follow-up    Left knee scope 09/23/2019  Patient presents today for follow up on his left knee. He had a left knee scope on 09/23/2019. He is now 5 weeks out from surgery. He states that his left knee is warmer than the right. He has been working, but limiting himself to protect it. He is not taking anything for pain except occasional Ibuprofen. He is still icing his knee twice daily.   HPI  Review of Systems   Objective: Vital Signs: Ht 5\' 11"  (1.803 m)   Wt 185 lb (83.9 kg)   BMI 25.80 kg/m   Physical Exam  Ortho Exam left knee without effusion.  Full extension little bit stiff when he first gets up from a sitting position but then walks without a limp.  Flexed over 100 degrees.  No instability.  No medial joint pain.  Specialty Comments:  No specialty comments available.  Imaging: No results found.   PMFS History: Patient Active Problem  List   Diagnosis Date Noted  . Pain in left knee 08/10/2019  . Excessive cerumen in left ear canal 01/25/2019  . Slow transit constipation 01/25/2019  . Right hip pain 05/21/2017  . Keratosis, actinic 06/05/2016  . Healthcare maintenance 05/21/2016  . Allergic rhinitis 04/27/2009  . 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 GERD 10/21/2006   Past Medical History:  Diagnosis Date  . ALLERGIC RHINITIS 04/27/2009  . Allergy   . ASTHMA, ACUTE 03/02/2010  . CHEST PAIN 04/27/2009  . CLOSED DISLOCATION OF ACROMIOCLAVICULAR 11/30/2007  . 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 GERD 10/21/2006    Family History  Problem Relation Age of Onset  . Hyperlipidemia Other   . Colon cancer Neg Hx   . Esophageal cancer Neg Hx   . Stomach cancer Neg Hx     Past Surgical History:  Procedure Laterality Date  . COLONOSCOPY  2006  . EYE SURGERY     foreign body R eye  . SHOULDER SURGERY Right 01/2013  . TONSILLECTOMY AND ADENOIDECTOMY     Social History   Occupational History  . Not on file  Tobacco Use  . Smoking status:  Never Smoker  . Smokeless tobacco: Former Systems developer    Types: Secondary school teacher  . Vaping Use: Never used  Substance and Sexual Activity  . Alcohol use: Yes    Alcohol/week: 7.0 standard drinks    Types: 7 Cans of beer per week  . Drug use: Not Currently  . Sexual activity: Yes

## 2020-01-27 ENCOUNTER — Encounter: Payer: Medicare HMO | Admitting: Family Medicine

## 2020-11-06 IMAGING — DX DG ORBITS FOR FOREIGN BODY
2 series · 2 of 2 positions shown · non-contrast
Comparison: No pertinent prior studies available for comparison.

CLINICAL DATA: Exposure to hazardous metals. Evaluate for metal in
eye prior to MRI.

EXAM:
ORBITS FOR FOREIGN BODY - 2 VIEW

[orbits waters (1 of 2)]
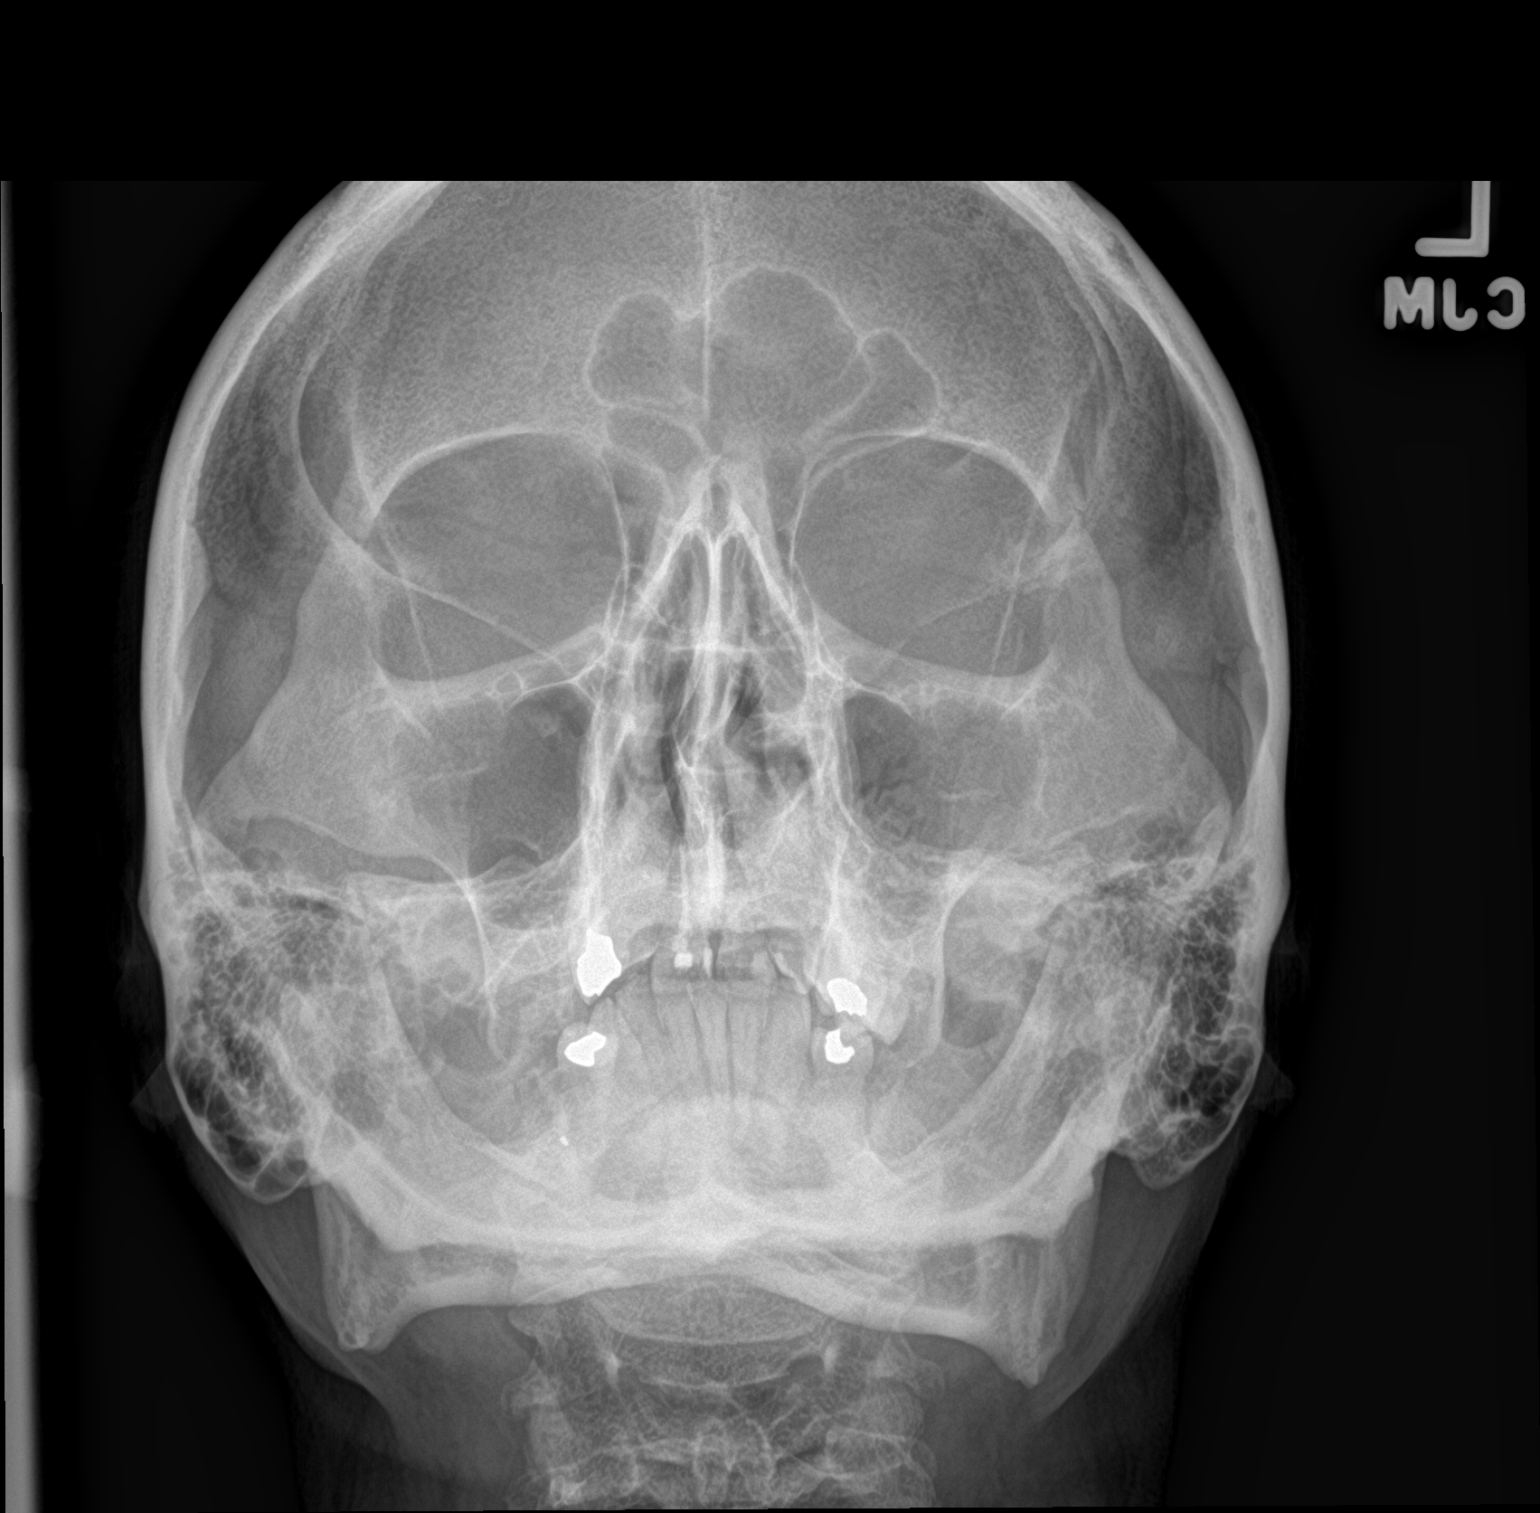

[orbits waters (2 of 2)]
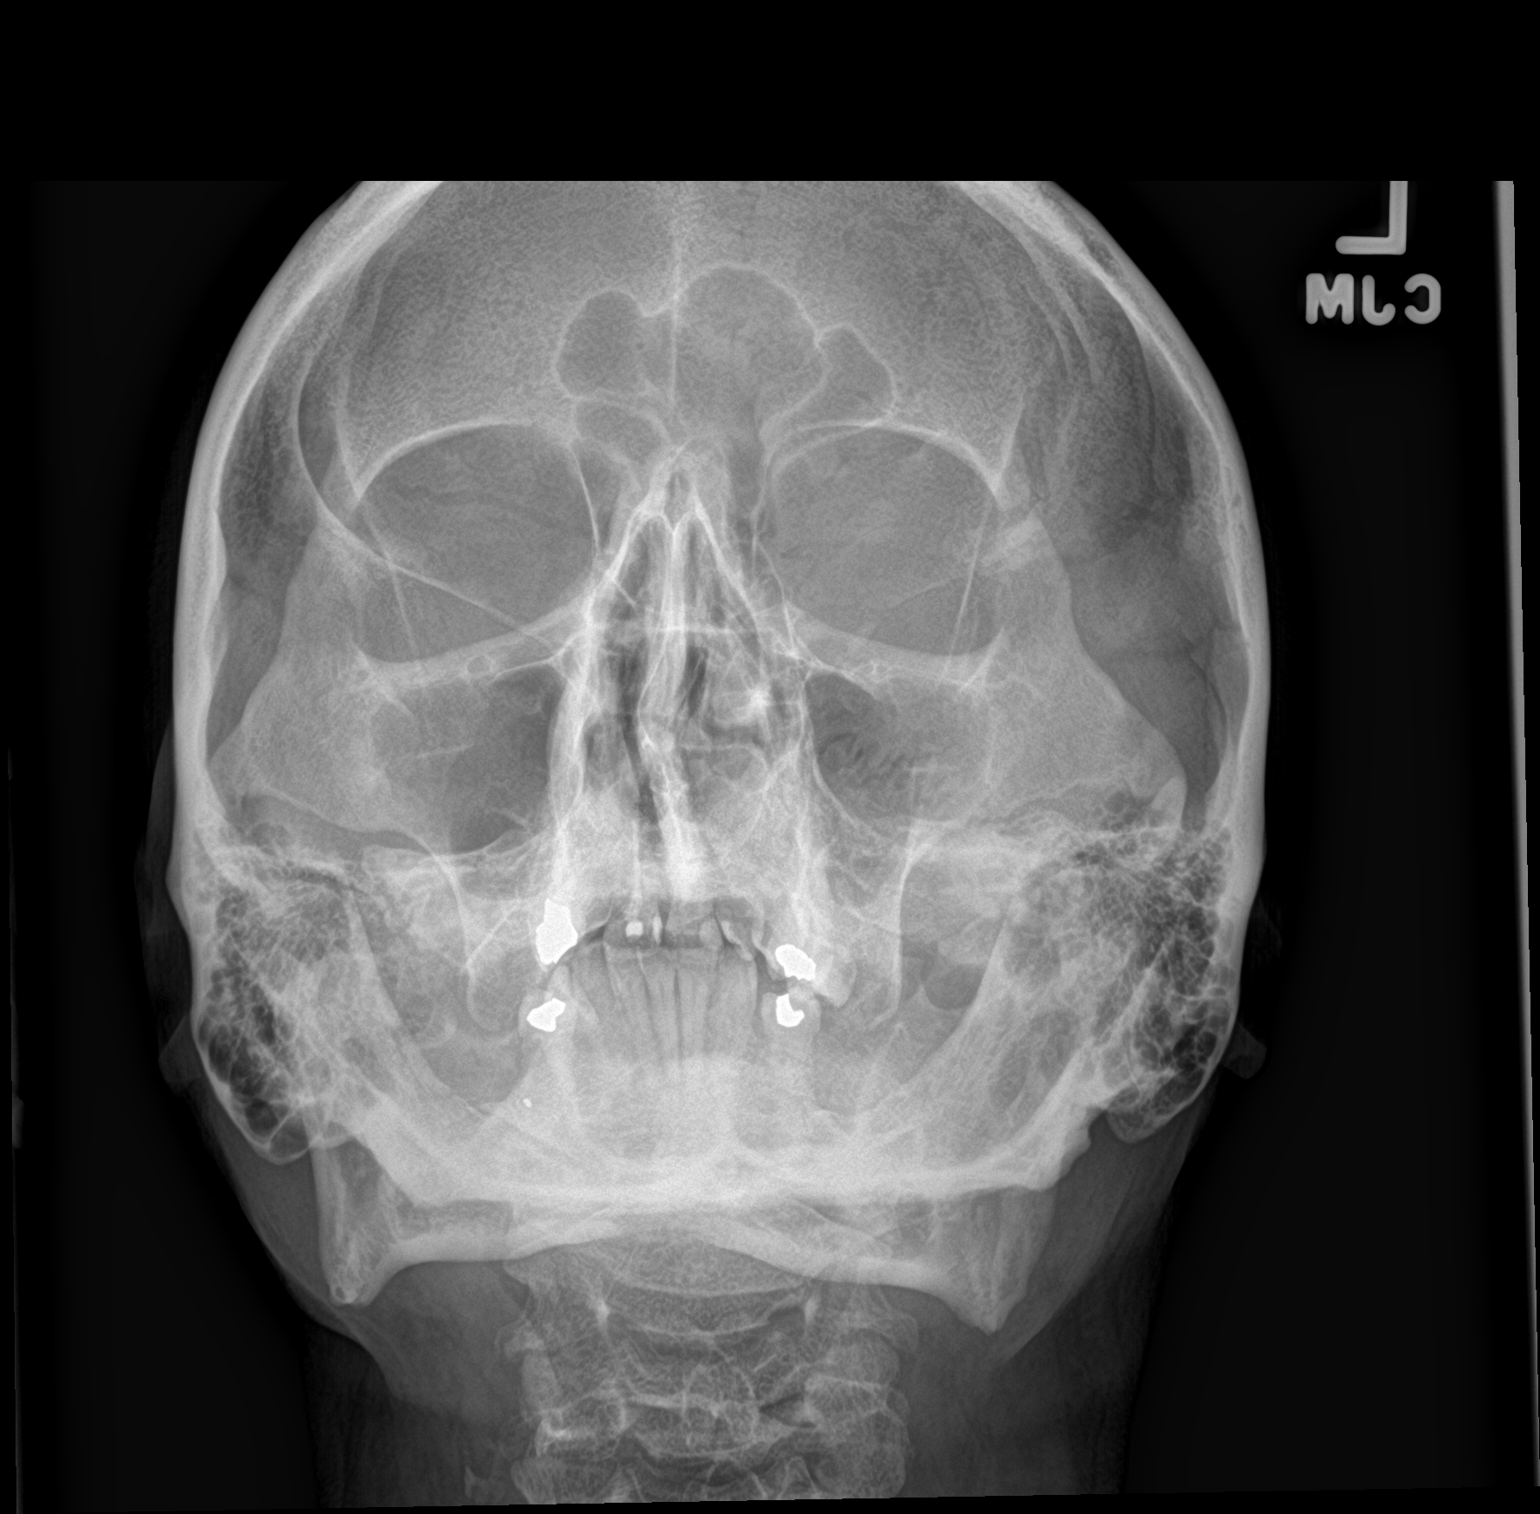

[2 of 2 positions shown; findings below may reference images not displayed]

FINDINGS: There is no evidence of metallic foreign body within the orbits. No
significant bone abnormality identified.
IMPRESSION: No evidence of metallic foreign body within the orbits.

## 2020-12-07 DIAGNOSIS — Z961 Presence of intraocular lens: Secondary | ICD-10-CM | POA: Diagnosis not present

## 2020-12-07 DIAGNOSIS — H52203 Unspecified astigmatism, bilateral: Secondary | ICD-10-CM | POA: Diagnosis not present

## 2020-12-07 DIAGNOSIS — H04123 Dry eye syndrome of bilateral lacrimal glands: Secondary | ICD-10-CM | POA: Diagnosis not present

## 2020-12-07 DIAGNOSIS — H5201 Hypermetropia, right eye: Secondary | ICD-10-CM | POA: Diagnosis not present

## 2020-12-07 DIAGNOSIS — H524 Presbyopia: Secondary | ICD-10-CM | POA: Diagnosis not present

## 2021-01-11 ENCOUNTER — Other Ambulatory Visit: Payer: Self-pay

## 2021-01-11 ENCOUNTER — Ambulatory Visit (INDEPENDENT_AMBULATORY_CARE_PROVIDER_SITE_OTHER): Payer: Medicare HMO | Admitting: Family Medicine

## 2021-01-11 ENCOUNTER — Encounter: Payer: Self-pay | Admitting: Family Medicine

## 2021-01-11 VITALS — BP 138/70 | HR 53 | Temp 97.3°F | Ht 71.0 in | Wt 185.4 lb

## 2021-01-11 DIAGNOSIS — N529 Male erectile dysfunction, unspecified: Secondary | ICD-10-CM | POA: Diagnosis not present

## 2021-01-11 DIAGNOSIS — Z0000 Encounter for general adult medical examination without abnormal findings: Secondary | ICD-10-CM | POA: Diagnosis not present

## 2021-01-11 LAB — LIPID PANEL
Cholesterol: 222 mg/dL — ABNORMAL HIGH (ref 0–200)
HDL: 82.8 mg/dL (ref >39.00)
LDL Cholesterol: 123 mg/dL — ABNORMAL HIGH (ref 0–99)
NonHDL: 138.95
Total CHOL/HDL Ratio: 3
Triglycerides: 80 mg/dL (ref 0.0–149.0)
VLDL: 16 mg/dL (ref 0.0–40.0)

## 2021-01-11 LAB — COMPREHENSIVE METABOLIC PANEL
ALT: 17 U/L (ref 0–53)
AST: 15 U/L (ref 0–37)
Albumin: 4.3 g/dL (ref 3.5–5.2)
Alkaline Phosphatase: 61 U/L (ref 39–117)
BUN: 19 mg/dL (ref 6–23)
CO2: 29 mEq/L (ref 19–32)
Calcium: 9.3 mg/dL (ref 8.4–10.5)
Chloride: 104 mEq/L (ref 96–112)
Creatinine, Ser: 0.94 mg/dL (ref 0.40–1.50)
GFR: 83.02 mL/min (ref >60.00)
Glucose, Bld: 109 mg/dL — ABNORMAL HIGH (ref 70–99)
Potassium: 3.9 mEq/L (ref 3.5–5.1)
Sodium: 139 mEq/L (ref 135–145)
Total Bilirubin: 0.4 mg/dL (ref 0.2–1.2)
Total Protein: 6.9 g/dL (ref 6.0–8.3)

## 2021-01-11 LAB — URINALYSIS, ROUTINE W REFLEX MICROSCOPIC
Bilirubin Urine: NEGATIVE
Hgb urine dipstick: NEGATIVE
Ketones, ur: NEGATIVE
Leukocytes,Ua: NEGATIVE
Nitrite: NEGATIVE
RBC / HPF: NONE SEEN (ref 0–?)
Specific Gravity, Urine: 1.025 (ref 1.000–1.030)
Total Protein, Urine: NEGATIVE
Urine Glucose: NEGATIVE
Urobilinogen, UA: 0.2 (ref 0.0–1.0)
pH: 6 (ref 5.0–8.0)

## 2021-01-11 LAB — CBC
HCT: 40.7 % (ref 39.0–52.0)
Hemoglobin: 14.3 g/dL (ref 13.0–17.0)
MCHC: 35.2 g/dL (ref 30.0–36.0)
MCV: 94 fl (ref 78.0–100.0)
Platelets: 266 10*3/uL (ref 150.0–400.0)
RBC: 4.33 Mil/uL (ref 4.22–5.81)
RDW: 13.2 % (ref 11.5–15.5)
WBC: 5.6 10*3/uL (ref 4.0–10.5)

## 2021-01-11 LAB — PSA: PSA: 0.48 ng/mL (ref 0.10–4.00)

## 2021-01-11 MED ORDER — TADALAFIL 20 MG PO TABS: 20.0000 mg | ORAL_TABLET | Freq: Every day | ORAL | 11 refills | Status: DC | PRN

## 2021-01-11 NOTE — Progress Notes (Signed)
Established Patient Office Visit  Subjective:  Patient ID: Evan Parker., male    DOB: [birth year]  Age: 70 y.o. MRN: [id number]  CC:  Chief Complaint  Patient presents with   Annual Exam    CPE, no concerns patient had breakfast at 7am.     HPI Evan Parker. presents for a physical and health check.  Doing well.  He is about to become a grandfather of a baby girl.  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  Vehemently refuses all vaccines.  Has no medical issues at this time.  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   GERD 10/21/2006    Past Surgical History:  Procedure Laterality Date   COLONOSCOPY  2006   EYE SURGERY     foreign body R eye   SHOULDER SURGERY Right 01/2013   TONSILLECTOMY AND ADENOIDECTOMY      Family History  Problem Relation Age of Onset   Hyperlipidemia Other    Colon cancer Neg Hx    Esophageal cancer Neg Hx    Stomach cancer Neg Hx     Social History   Socioeconomic History   Marital status: Married    Spouse name: Not on file   Number of children: Not on file   Years of education: Not on file   Highest education level: Not on file  Occupational History   Not on file  Tobacco Use   Smoking status: Never   Smokeless tobacco: Former    Types: Nurse, children's Use: Never used  Substance and Sexual Activity   Alcohol use: Yes    Alcohol/week: 7.0 standard drinks    Types: 7 Cans of beer per week   Drug use: Not Currently   Sexual activity: Yes  Other Topics Concern   Not on file  Social History Narrative   Not on file   Social Determinants of Health   Financial Resource Strain: Not on file  Food Insecurity: Not on file  Transportation Needs: Not on file  Physical Activity: Not on file   Stress: Not on file  Social Connections: Not on file  Intimate Partner Violence: Not on file    Outpatient Medications Prior to Visit  Medication Sig Dispense Refill   albuterol (VENTOLIN HFA) 108 (90 Base) MCG/ACT inhaler Inhale 2 puffs into the lungs every 6 (six) hours as needed. 1 Inhaler 2   cetirizine (ZYRTEC) 10 MG tablet Take 10 mg by mouth daily.       psyllium (METAMUCIL SMOOTH TEXTURE) 58.6 % powder Take 1 packet by mouth 3 (three) times daily. 283 g 12   triamcinolone (KENALOG) 0.025 % ointment Apply twice daily to hands as needed for eczema. 80 g 0   tadalafil (CIALIS) 20 MG tablet Take 1 tablet (20 mg total) by mouth daily as needed. 10 tablet 11   benzonatate (TESSALON PERLES) 100 MG capsule Take 1 capsule (100 mg total) by mouth 3 (three) times daily as needed. 20 capsule 0   Carbamide Peroxide-Saline (EAR WAX CLEANSING) 6.5 % KIT Per kit. 1 kit 2   oxyCODONE (ROXICODONE) 5 MG immediate release tablet Take 1-2 tablets (5-10 mg total) by mouth every 6 (six) hours as needed for severe pain. 30 tablet  0   sildenafil (REVATIO) 20 MG tablet Take 1 to 2 tabs 2 - 3 hours before sex 30 tablet 11   Facility-Administered Medications Prior to Visit  Medication Dose Route Frequency Provider Last Rate Last Admin   0.9 %  sodium chloride infusion  500 mL Intravenous Once Irene Shipper, MD        No Known Allergies  ROS Review of Systems  Constitutional:  Negative for chills, diaphoresis, fatigue, fever and unexpected weight change.  HENT: Negative.    Eyes:  Negative for photophobia and visual disturbance.  Respiratory: Negative.    Cardiovascular: Negative.   Gastrointestinal: Negative.   Endocrine: Negative for polyphagia and polyuria.  Genitourinary:  Negative for difficulty urinating and frequency.  Musculoskeletal:  Negative for gait problem and joint swelling.  Skin:  Negative for pallor and rash.  Neurological:  Negative for speech difficulty and weakness.   Psychiatric/Behavioral: Negative.       Objective:    Physical Exam Vitals and nursing note reviewed.  Constitutional:      General: He is not in acute distress.    Appearance: Normal appearance. He is normal weight. He is not ill-appearing, toxic-appearing or diaphoretic.  HENT:     Head: Normocephalic and atraumatic.     Right Ear: Tympanic membrane, ear canal and external ear normal.     Left Ear: Tympanic membrane, ear canal and external ear normal.     Mouth/Throat:     Mouth: Mucous membranes are moist.     Pharynx: Oropharynx is clear. No oropharyngeal exudate or posterior oropharyngeal erythema.  Eyes:     General: No scleral icterus.       Right eye: No discharge.        Left eye: No discharge.     Extraocular Movements: Extraocular movements intact.     Conjunctiva/sclera: Conjunctivae normal.     Pupils: Pupils are equal, round, and reactive to light.  Neck:     Vascular: No carotid bruit.  Cardiovascular:     Rate and Rhythm: Normal rate and regular rhythm.  Pulmonary:     Effort: Pulmonary effort is normal.     Breath sounds: Normal breath sounds.  Abdominal:     General: Abdomen is flat. Bowel sounds are normal. There is no distension.     Palpations: Abdomen is soft. There is no mass.     Tenderness: There is no abdominal tenderness. There is no guarding or rebound.     Hernia: No hernia is present.  Genitourinary:    Prostate: Enlarged. Not tender and no nodules present.     Rectum: Guaiac result negative. No mass, tenderness, anal fissure, external hemorrhoid or internal hemorrhoid. Normal anal tone.  Musculoskeletal:     Cervical back: No rigidity or tenderness.     Right lower leg: No edema.     Left lower leg: No edema.  Lymphadenopathy:     Cervical: No cervical adenopathy.  Skin:    General: Skin is warm and dry.  Neurological:     Mental Status: He is alert and oriented to person, place, and time.  Psychiatric:        Mood and Affect: Mood  normal.        Behavior: Behavior normal.    BP 138/70 (BP Location: Right Arm, Patient Position: Sitting, Cuff Size: Normal)   Pulse (!) 53   Temp (!) 97.3 F (36.3 C) (Temporal)   Ht _0  (1.803 m)   Wt 185  lb 6.4 oz (84.1 kg)   SpO2 97%   BMI 25.86 kg/m  Wt Readings from Last 3 Encounters:  01/11/21 185 lb 6.4 oz (84.1 kg)  10/27/19 185 lb (83.9 kg)  10/07/19 185 lb (83.9 kg)     Health Maintenance Due  Topic Date Due   Hepatitis C Screening  Never done    There are no preventive care reminders to display for this patient.  Lab Results  Component Value Date   TSH 2.06 05/21/2017   Lab Results  Component Value Date   WBC 5.2 01/25/2019   HGB 14.6 01/25/2019   HCT 42.7 01/25/2019   MCV 94.8 01/25/2019   PLT 289.0 01/25/2019   Lab Results  Component Value Date   NA 140 01/25/2019   K 4.2 01/25/2019   CO2 28 01/25/2019   GLUCOSE 97 01/25/2019   BUN 16 01/25/2019   CREATININE 0.82 01/25/2019   BILITOT 1.0 01/25/2019   ALKPHOS 58 01/25/2019   AST 17 01/25/2019   ALT 19 01/25/2019   PROT 6.7 01/25/2019   ALBUMIN 3.9 01/25/2019   CALCIUM 9.3 01/25/2019   GFR 93.71 01/25/2019   Lab Results  Component Value Date   CHOL 197 01/25/2019   Lab Results  Component Value Date   HDL 78.00 01/25/2019   Lab Results  Component Value Date   LDLCALC 112 (H) 01/25/2019   Lab Results  Component Value Date   TRIG 38.0 01/25/2019   Lab Results  Component Value Date   CHOLHDL 3 01/25/2019   No results found for: HGBA1C    Assessment & Plan:   Problem List Items Addressed This Visit       Other   ERECTILE DYSFUNCTION, ORGANIC   Relevant Medications   tadalafil (CIALIS) 20 MG tablet   Healthcare maintenance - Primary   Relevant Orders   CBC   Comprehensive metabolic panel   Lipid panel   PSA   Urinalysis, Routine w reflex microscopic    Meds ordered this encounter  Medications   tadalafil (CIALIS) 20 MG tablet    Sig: Take 1 tablet (20 mg  total) by mouth daily as needed.    Dispense:  10 tablet    Refill:  11    Follow-up: Return in about 1 year (around 01/11/2022), or if symptoms worsen or fail to improve.  Encouraged him to maintain his healthy lifestyle.  He was given information on health maintenance and disease prevention as well as recommended immunizations for his age group.  At this point he refuses all immunizations but said that he would take a look.  Libby Maw, MD

## 2021-03-15 ENCOUNTER — Ambulatory Visit (INDEPENDENT_AMBULATORY_CARE_PROVIDER_SITE_OTHER): Payer: Medicare HMO | Admitting: Family Medicine

## 2021-03-15 ENCOUNTER — Encounter: Payer: Self-pay | Admitting: Family Medicine

## 2021-03-15 VITALS — BP 132/70 | HR 53 | Temp 98.4°F | Ht 71.0 in | Wt 179.6 lb

## 2021-03-15 DIAGNOSIS — J019 Acute sinusitis, unspecified: Secondary | ICD-10-CM

## 2021-03-15 DIAGNOSIS — L739 Follicular disorder, unspecified: Secondary | ICD-10-CM

## 2021-03-15 DIAGNOSIS — Z23 Encounter for immunization: Secondary | ICD-10-CM | POA: Diagnosis not present

## 2021-03-15 DIAGNOSIS — N529 Male erectile dysfunction, unspecified: Secondary | ICD-10-CM

## 2021-03-15 MED ORDER — TADALAFIL 20 MG PO TABS: ORAL_TABLET | ORAL | 11 refills | Status: DC

## 2021-03-15 MED ORDER — DOXYCYCLINE HYCLATE 100 MG PO TABS
100.0000 mg | ORAL_TABLET | Freq: Two times a day (BID) | ORAL | 0 refills | Status: AC
Start: 2021-03-15 — End: 2021-03-25

## 2021-03-15 NOTE — Progress Notes (Signed)
Established Patient Office Visit  Subjective:  Patient ID: Evan Parker., male    DOB: 07-Dec-1950  Age: 71 y.o. MRN: [id number]  CC:  Chief Complaint  Patient presents with   Rash    Rash on back very itchy with some burning becoming worse. Sinus issues nasal congestion. Would like something stronger for ED, would like shingles and Prevnar vaccines.     HPI Evan Parker. presents for evaluation of sinus problems, rash on his back and ED. pharmacy never contacted him about the tadalafil prescription.  He has not tried it yet.  Reports a 3-week history of URI symptoms now with facial pressure yellow rhinorrhea.  There is postnasal drip.  No fevers or chills.  No cough to speak of.  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  He has a rash on his back that itches and burns.  It is on both sides.  He would like to have the Prevnar 20 vaccine as well as the Shingrix vaccine.  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   GERD 10/21/2006    Past Surgical History:  Procedure Laterality Date   COLONOSCOPY  2006   EYE SURGERY     foreign body R eye   SHOULDER SURGERY Right 01/2013   TONSILLECTOMY AND ADENOIDECTOMY      Family History  Problem Relation Age of Onset   Hyperlipidemia Other    Colon cancer Neg Hx    Esophageal cancer Neg Hx    Stomach cancer Neg Hx     Social History   Socioeconomic History   Marital status: Married    Spouse name: Not on file   Number of children: Not on file   Years of education: Not on file   Highest education level: Not on file  Occupational History   Not on file  Tobacco Use   Smoking status: Never   Smokeless tobacco: Former    Types: Nurse, children's Use: Never used  Substance and  Sexual Activity   Alcohol use: Yes    Alcohol/week: 7.0 standard drinks    Types: 7 Cans of beer per week   Drug use: Not Currently   Sexual activity: Yes  Other Topics Concern   Not on file  Social History Narrative   Not on file   Social Determinants of Health   Financial Resource Strain: Not on file  Food Insecurity: Not on file  Transportation Needs: Not on file  Physical Activity: Not on file  Stress: Not on file  Social Connections: Not on file  Intimate Partner Violence: Not on file    Outpatient Medications Prior to Visit  Medication Sig Dispense Refill   albuterol (VENTOLIN HFA) 108 (90 Base) MCG/ACT inhaler Inhale 2 puffs into the lungs every 6 (six) hours as needed. 1 Inhaler 2   cetirizine (ZYRTEC) 10 MG tablet Take 10 mg by mouth daily.       tadalafil (CIALIS) 20 MG tablet Take 1 tablet (20 mg total) by mouth daily as needed. 10 tablet 11   psyllium (METAMUCIL SMOOTH TEXTURE) 58.6 % powder Take 1 packet by mouth 3 (three) times daily. 283 g 12   triamcinolone (KENALOG) 0.025 % ointment  Apply twice daily to hands as needed for eczema. (Patient not taking: Reported on 03/15/2021) 80 g 0   Facility-Administered Medications Prior to Visit  Medication Dose Route Frequency Provider Last Rate Last Admin   0.9 %  sodium chloride infusion  500 mL Intravenous Once Irene Shipper, MD        No Known Allergies  ROS Review of Systems  Constitutional:  Negative for chills, diaphoresis, fatigue, fever and unexpected weight change.  HENT:  Positive for congestion, postnasal drip, rhinorrhea, sinus pressure and sinus pain.   Eyes:  Negative for photophobia and visual disturbance.  Respiratory:  Negative for cough, chest tightness and wheezing.   Cardiovascular: Negative.   Gastrointestinal: Negative.   Endocrine: Negative for polyphagia and polyuria.  Genitourinary: Negative.   Musculoskeletal:  Negative for gait problem and joint swelling.  Skin:  Positive for rash.   Neurological:  Negative for speech difficulty, weakness and headaches.  Hematological:  Does not bruise/bleed easily.  Psychiatric/Behavioral: Negative.       Objective:    Physical Exam Vitals and nursing note reviewed.  Constitutional:      General: He is not in acute distress.    Appearance: Normal appearance. He is normal weight. He is not ill-appearing, toxic-appearing or diaphoretic.  HENT:     Head: Normocephalic and atraumatic.     Right Ear: Tympanic membrane, ear canal and external ear normal.     Left Ear: Tympanic membrane, ear canal and external ear normal.     Mouth/Throat:     Mouth: Mucous membranes are moist.     Pharynx: Oropharynx is clear. No oropharyngeal exudate or posterior oropharyngeal erythema.  Eyes:     General: No scleral icterus.       Right eye: No discharge.        Left eye: No discharge.     Extraocular Movements: Extraocular movements intact.     Conjunctiva/sclera: Conjunctivae normal.     Pupils: Pupils are equal, round, and reactive to light.  Cardiovascular:     Rate and Rhythm: Normal rate and regular rhythm.  Pulmonary:     Effort: Pulmonary effort is normal. No respiratory distress.     Breath sounds: No stridor. No wheezing, rhonchi or rales.  Abdominal:     General: Bowel sounds are normal.  Musculoskeletal:     Cervical back: No rigidity or tenderness.  Lymphadenopathy:     Cervical: No cervical adenopathy.  Skin:    General: Skin is warm and dry.       Neurological:     Mental Status: He is alert and oriented to person, place, and time.  Psychiatric:        Mood and Affect: Mood normal.        Behavior: Behavior normal.    BP 132/70 (BP Location: Left Arm, Patient Position: Sitting, Cuff Size: Normal)    Pulse (!) 53    Temp 98.4 F (36.9 C) (Temporal)    Ht 5\' 11"  (1.803 m)    Wt 179 lb 9.6 oz (81.5 kg)    SpO2 98%    BMI 25.05 kg/m  Wt Readings from Last 3 Encounters:  03/15/21 179 lb 9.6 oz (81.5 kg)  01/11/21 185  lb 6.4 oz (84.1 kg)  10/27/19 185 lb (83.9 kg)     Health Maintenance Due  Topic Date Due   Hepatitis C Screening  Never done    There are no preventive care reminders to display for this patient.  Lab Results  Component Value Date   TSH 2.06 05/21/2017   Lab Results  Component Value Date   WBC 5.6 01/11/2021   HGB 14.3 01/11/2021   HCT 40.7 01/11/2021   MCV 94.0 01/11/2021   PLT 266.0 01/11/2021   Lab Results  Component Value Date   NA 139 01/11/2021   K 3.9 01/11/2021   CO2 29 01/11/2021   GLUCOSE 109 (H) 01/11/2021   BUN 19 01/11/2021   CREATININE 0.94 01/11/2021   BILITOT 0.4 01/11/2021   ALKPHOS 61 01/11/2021   AST 15 01/11/2021   ALT 17 01/11/2021   PROT 6.9 01/11/2021   ALBUMIN 4.3 01/11/2021   CALCIUM 9.3 01/11/2021   GFR 83.02 01/11/2021   Lab Results  Component Value Date   CHOL 222 (H) 01/11/2021   Lab Results  Component Value Date   HDL 82.80 01/11/2021   Lab Results  Component Value Date   LDLCALC 123 (H) 01/11/2021   Lab Results  Component Value Date   TRIG 80.0 01/11/2021   Lab Results  Component Value Date   CHOLHDL 3 01/11/2021   No results found for: HGBA1C    Assessment & Plan:   Problem List Items Addressed This Visit       Respiratory   Acute sinusitis   Relevant Medications   doxycycline (VIBRA-TABS) 100 MG tablet     Musculoskeletal and Integument   Folliculitis   Relevant Medications   doxycycline (VIBRA-TABS) 100 MG tablet     Other   ERECTILE DYSFUNCTION, ORGANIC - Primary   Relevant Medications   tadalafil (CIALIS) 20 MG tablet   Other Visit Diagnoses     Need for vaccination against Streptococcus pneumoniae       Relevant Orders   Pneumococcal conjugate vaccine 20-valent (Prevnar 20) (Completed)   Need for shingles vaccine       Relevant Orders   Varicella-zoster vaccine IM (Shingrix) (Completed)       Meds ordered this encounter  Medications   doxycycline (VIBRA-TABS) 100 MG tablet    Sig:  Take 1 tablet (100 mg total) by mouth 2 (two) times daily for 10 days.    Dispense:  20 tablet    Refill:  0   tadalafil (CIALIS) 20 MG tablet    Sig: May take one q od as needed for ED.    Dispense:  10 tablet    Refill:  11    Follow-up: Return if symptoms worsen or fail to improve, for Consider Grover's Disease. Libby Maw, MD

## 2021-05-28 ENCOUNTER — Telehealth: Payer: Self-pay | Admitting: Family Medicine

## 2021-05-28 NOTE — Telephone Encounter (Signed)
Left message for patient to call back and schedule Medicare Annual Wellness Visit (AWV) in office.  ° °If not able to come in office, please offer to do virtually or by telephone.  Left office number and my jabber #[phone]. ° °Due for AWVI ° °Please schedule at anytime with Nurse Health Advisor. °  °

## 2021-07-16 ENCOUNTER — Ambulatory Visit (INDEPENDENT_AMBULATORY_CARE_PROVIDER_SITE_OTHER): Payer: Medicare HMO | Admitting: Family Medicine

## 2021-07-16 ENCOUNTER — Encounter: Payer: Self-pay | Admitting: Family Medicine

## 2021-07-16 VITALS — BP 136/84 | HR 71 | Temp 97.5°F | Wt 181.4 lb

## 2021-07-16 DIAGNOSIS — R69 Illness, unspecified: Secondary | ICD-10-CM | POA: Diagnosis not present

## 2021-07-16 DIAGNOSIS — Z23 Encounter for immunization: Secondary | ICD-10-CM | POA: Diagnosis not present

## 2021-07-16 DIAGNOSIS — Z818 Family history of other mental and behavioral disorders: Secondary | ICD-10-CM

## 2021-07-16 DIAGNOSIS — S60453A Superficial foreign body of left middle finger, initial encounter: Secondary | ICD-10-CM

## 2021-07-16 NOTE — Progress Notes (Signed)
Established Patient Office Visit  Subjective   Patient ID: Evan Cone., male    DOB: 11/17/1950  Age: 71 y.o. MRN: [id number]  Chief Complaint  Patient presents with   Follow-up    4 month follow up. Patient would like to discuss 2nd shingles vaccine. He is not fasting.     HPI for second shingles vaccine, status post minor and third finger and family stress.  The mother of his 61-year-old granddaughter is using and drinking alcohol.  She has dropped the child off with patient on several occasions.  Mother is not getting along with patient's son.  An intervention last night did not seem to go well.  Wonders what to do.  Just removed a splinter intact from his finger and wants me to check it.    Review of Systems  Constitutional: Negative.   HENT: Negative.    Eyes:  Negative for blurred vision, discharge and redness.  Respiratory: Negative.    Cardiovascular: Negative.   Gastrointestinal:  Negative for abdominal pain.  Genitourinary: Negative.   Musculoskeletal: Negative.  Negative for myalgias.  Skin:  Negative for rash.  Neurological:  Negative for tingling, loss of consciousness and weakness.  Endo/Heme/Allergies:  Negative for polydipsia.  Psychiatric/Behavioral:  The patient is nervous/anxious.      Objective:     BP 136/84 (BP Location: Right Arm, Patient Position: Sitting, Cuff Size: Large)   Pulse 71   Temp (!) 97.5 F (36.4 C) (Temporal)   Wt 181 lb 6.4 oz (82.3 kg)   SpO2 98%   BMI 25.30 kg/m    Physical Exam Constitutional:      General: He is not in acute distress.    Appearance: Normal appearance. He is not ill-appearing, toxic-appearing or diaphoretic.  HENT:     Head: Normocephalic and atraumatic.     Right Ear: External ear normal.     Left Ear: External ear normal.  Eyes:     General: No scleral icterus.       Right eye: No discharge.        Left eye: No discharge.     Extraocular Movements: Extraocular movements intact.      Conjunctiva/sclera: Conjunctivae normal.     Pupils: Pupils are equal, round, and reactive to light.  Cardiovascular:     Rate and Rhythm: Normal rate and regular rhythm.  Pulmonary:     Effort: Pulmonary effort is normal. No respiratory distress.     Breath sounds: Normal breath sounds.  Abdominal:     General: Bowel sounds are normal.  Musculoskeletal:     Cervical back: No rigidity or tenderness.  Skin:    General: Skin is warm and dry.       Neurological:     Mental Status: He is alert and oriented to person, place, and time.  Psychiatric:        Mood and Affect: Mood normal.        Behavior: Behavior normal.     No results found for any visits on 07/16/21.    The 10-year ASCVD risk score (Arnett DK, et al., 2019) is: 15.6%    Assessment & Plan:   Problem List Items Addressed This Visit       Other   Foreign body of left middle finger   Need for shingles vaccine - Primary   Relevant Orders   Varicella-zoster vaccine IM (Completed)   Family history of stress    Return in about 6 months (around  01/16/2022), or if symptoms worsen or fail to improve.  We discussed the nature of addiction expected.  Advised him to join Al-Anon.  Information was given on injection and right covering his [place] [email or web address]   Libby Maw, MD

## 2021-07-25 ENCOUNTER — Ambulatory Visit (INDEPENDENT_AMBULATORY_CARE_PROVIDER_SITE_OTHER): Payer: Medicare HMO

## 2021-07-25 DIAGNOSIS — Z0000 Encounter for general adult medical examination without abnormal findings: Secondary | ICD-10-CM

## 2021-07-25 NOTE — Patient Instructions (Signed)
Evan Parker , Thank you for taking time to come for your Medicare Wellness Visit. I appreciate your ongoing commitment to your health goals. Please review the following plan we discussed and let me know if I can assist you in the future.   Screening recommendations/referrals: Colonoscopy: 08/04/2017 Recommended yearly ophthalmology/optometry visit for glaucoma screening and checkup Recommended yearly dental visit for hygiene and checkup  Vaccinations: Influenza vaccine: declined  Pneumococcal vaccine: completed  Tdap vaccine: due  Shingles vaccine: completed     Advanced directives: none   Conditions/risks identified: none   Next appointment: none   Preventive Care 64 Years and Older, Male Preventive care refers to lifestyle choices and visits with your health care provider that can promote health and wellness. What does preventive care include? A yearly physical exam. This is also called an annual well check. Dental exams once or twice a year. Routine eye exams. Ask your health care provider how often you should have your eyes checked. Personal lifestyle choices, including: Daily care of your teeth and gums. Regular physical activity. Eating a healthy diet. Avoiding tobacco and drug use. Limiting alcohol use. Practicing safe sex. Taking low doses of aspirin every day. Taking vitamin and mineral supplements as recommended by your health care provider. What happens during an annual well check? The services and screenings done by your health care provider during your annual well check will depend on your age, overall health, lifestyle risk factors, and family history of disease. Counseling  Your health care provider may ask you questions about your: Alcohol use. Tobacco use. Drug use. Emotional well-being. Home and relationship well-being. Sexual activity. Eating habits. History of falls. Memory and ability to understand (cognition). Work and work Statistician. Screening   You may have the following tests or measurements: Height, weight, and BMI. Blood pressure. Lipid and cholesterol levels. These may be checked every 5 years, or more frequently if you are over 93 years old. Skin check. Lung cancer screening. You may have this screening every year starting at age 82 if you have a 30-pack-year history of smoking and currently smoke or have quit within the past 15 years. Fecal occult blood test (FOBT) of the stool. You may have this test every year starting at age 19. Flexible sigmoidoscopy or colonoscopy. You may have a sigmoidoscopy every 5 years or a colonoscopy every 10 years starting at age 31. Prostate cancer screening. Recommendations will vary depending on your family history and other risks. Hepatitis C blood test. Hepatitis B blood test. Sexually transmitted disease (STD) testing. Diabetes screening. This is done by checking your blood sugar (glucose) after you have not eaten for a while (fasting). You may have this done every 1-3 years. Abdominal aortic aneurysm (AAA) screening. You may need this if you are a current or former smoker. Osteoporosis. You may be screened starting at age 72 if you are at high risk. Talk with your health care provider about your test results, treatment options, and if necessary, the need for more tests. Vaccines  Your health care provider may recommend certain vaccines, such as: Influenza vaccine. This is recommended every year. Tetanus, diphtheria, and acellular pertussis (Tdap, Td) vaccine. You may need a Td booster every 10 years. Zoster vaccine. You may need this after age 68. Pneumococcal 13-valent conjugate (PCV13) vaccine. One dose is recommended after age 53. Pneumococcal polysaccharide (PPSV23) vaccine. One dose is recommended after age 78. Talk to your health care provider about which screenings and vaccines you need and how often you  need them. This information is not intended to replace advice given to you by  your health care provider. Make sure you discuss any questions you have with your health care provider. Document Released: 03/10/2015 Document Revised: 11/01/2015 Document Reviewed: 12/13/2014 Elsevier Interactive Patient Education  2017 [place] Prevention in the Home Falls can cause injuries. They can happen to people of all ages. There are many things you can do to make your home safe and to help prevent falls. What can I do on the outside of my home? Regularly fix the edges of walkways and driveways and fix any cracks. Remove anything that might make you trip as you walk through a door, such as a raised step or threshold. Trim any bushes or trees on the path to your home. Use bright outdoor lighting. Clear any walking paths of anything that might make someone trip, such as rocks or tools. Regularly check to see if handrails are loose or broken. Make sure that both sides of any steps have handrails. Any raised decks and porches should have guardrails on the edges. Have any leaves, snow, or ice cleared regularly. Use sand or salt on walking paths during winter. Clean up any spills in your garage right away. This includes oil or grease spills. What can I do in the bathroom? Use night lights. Install grab bars by the toilet and in the tub and shower. Do not use towel bars as grab bars. Use non-skid mats or decals in the tub or shower. If you need to sit down in the shower, use a plastic, non-slip stool. Keep the floor dry. Clean up any water that spills on the floor as soon as it happens. Remove soap buildup in the tub or shower regularly. Attach bath mats securely with double-sided non-slip rug tape. Do not have throw rugs and other things on the floor that can make you trip. What can I do in the bedroom? Use night lights. Make sure that you have a light by your bed that is easy to reach. Do not use any sheets or blankets that are too big for your bed. They should not hang  down onto the floor. Have a firm chair that has side arms. You can use this for support while you get dressed. Do not have throw rugs and other things on the floor that can make you trip. What can I do in the kitchen? Clean up any spills right away. Avoid walking on wet floors. Keep items that you use a lot in easy-to-reach places. If you need to reach something above you, use a strong step stool that has a grab bar. Keep electrical cords out of the way. Do not use floor polish or wax that makes floors slippery. If you must use wax, use non-skid floor wax. Do not have throw rugs and other things on the floor that can make you trip. What can I do with my stairs? Do not leave any items on the stairs. Make sure that there are handrails on both sides of the stairs and use them. Fix handrails that are broken or loose. Make sure that handrails are as long as the stairways. Check any carpeting to make sure that it is firmly attached to the stairs. Fix any carpet that is loose or worn. Avoid having throw rugs at the top or bottom of the stairs. If you do have throw rugs, attach them to the floor with carpet tape. Make sure that you have a light switch  at the top of the stairs and the bottom of the stairs. If you do not have them, ask someone to add them for you. What else can I do to help prevent falls? Wear shoes that: Do not have high heels. Have rubber bottoms. Are comfortable and fit you well. Are closed at the toe. Do not wear sandals. If you use a stepladder: Make sure that it is fully opened. Do not climb a closed stepladder. Make sure that both sides of the stepladder are locked into place. Ask someone to hold it for you, if possible. Clearly mark and make sure that you can see: Any grab bars or handrails. First and last steps. Where the edge of each step is. Use tools that help you move around (mobility aids) if they are needed. These  include: Canes. Walkers. Scooters. Crutches. Turn on the lights when you go into a dark area. Replace any light bulbs as soon as they burn out. Set up your furniture so you have a clear path. Avoid moving your furniture around. If any of your floors are uneven, fix them. If there are any pets around you, be aware of where they are. Review your medicines with your doctor. Some medicines can make you feel dizzy. This can increase your chance of falling. Ask your doctor what other things that you can do to help prevent falls. This information is not intended to replace advice given to you by your health care provider. Make sure you discuss any questions you have with your health care provider. Document Released: 12/08/2008 Document Revised: 07/20/2015 Document Reviewed: 03/18/2014 Elsevier Interactive Patient Education  2017 [employer].

## 2021-07-25 NOTE — Progress Notes (Signed)
Subjective:   Evan Parker. is a 71 y.o. male who presents for an Initial Medicare Annual Wellness Visit.   I connected with Evan Parker  today by telephone and verified that I am speaking with the correct person using two identifiers. Location patient: home Location provider: work Persons participating in the virtual visit: patient, provider.   I discussed the limitations, risks, security and privacy concerns of performing an evaluation and management service by telephone and the availability of in person appointments. I also discussed with the patient that there may be a patient responsible charge related to this service. The patient expressed understanding and verbally consented to this telephonic visit.    Interactive audio and video telecommunications were attempted between this provider and patient, however failed, due to patient having technical difficulties OR patient did not have access to video capability.  We continued and completed visit with audio only.    Review of Systems     Cardiac Risk Factors include: advanced age (>39mn, >>35women);male gender     Objective:    Today's Vitals   There is no height or weight on file to calculate BMI.     07/25/2021    3:38 PM 08/04/2017   10:32 AM  Advanced Directives  Does Patient Have a Medical Advance Directive? No No  Would patient like information on creating a medical advance directive? No - Patient declined No - Patient declined    Current Medications (verified) Outpatient Encounter Medications as of 07/25/2021  Medication Sig   albuterol (VENTOLIN HFA) 108 (90 Base) MCG/ACT inhaler Inhale 2 puffs into the lungs every 6 (six) hours as needed.   cetirizine (ZYRTEC) 10 MG tablet Take 10 mg by mouth daily.     psyllium (METAMUCIL SMOOTH TEXTURE) 58.6 % powder Take 1 packet by mouth 3 (three) times daily.   tadalafil (CIALIS) 20 MG tablet May take one q od as needed for ED.   triamcinolone (KENALOG) 0.025 % ointment  Apply twice daily to hands as needed for eczema.   Facility-Administered Encounter Medications as of 07/25/2021  Medication   0.9 %  sodium chloride infusion    Allergies (verified) Patient has no known allergies.   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   GERD 10/21/2006   Past Surgical History:  Procedure Laterality Date   COLONOSCOPY  2006   EYE SURGERY     foreign body R eye   SHOULDER SURGERY Right 01/2013   TONSILLECTOMY AND ADENOIDECTOMY     Family History  Problem Relation Age of Onset   Hyperlipidemia Other    Colon cancer Neg Hx    Esophageal cancer Neg Hx    Stomach cancer Neg Hx    Social History   Socioeconomic History   Marital status: Married    Spouse name: Not on file   Number of children: Not on file   Years of education: Not on file   Highest education level: Not on file  Occupational History   Not on file  Tobacco Use   Smoking status: Never   Smokeless tobacco: Former    Types: CNurse, children'sUse: Never used  Substance and Sexual Activity   Alcohol use: Yes    Alcohol/week: 7.0 standard drinks  Types: 7 Cans of beer per week   Drug use: Not Currently   Sexual activity: Yes  Other Topics Concern   Not on file  Social History Narrative   Not on file   Social Determinants of Health   Financial Resource Strain: Low Risk    Difficulty of Paying Living Expenses: Not hard at all  Food Insecurity: No Food Insecurity   Worried About Charity fundraiser in the Last Year: Never true   [place] in the Last Year: Never true  Transportation Needs: No Transportation Needs   Lack of Transportation (Medical): No   Lack of Transportation (Non-Medical): No  Physical Activity: Sufficiently Active   Days of  Exercise per Week: 5 days   Minutes of Exercise per Session: 50 min  Stress: No Stress Concern Present   Feeling of Stress : Not at all  Social Connections: Moderately Isolated   Frequency of Communication with Friends and Family: Three times a week   Frequency of Social Gatherings with Friends and Family: Three times a week   Attends Religious Services: Never   Active Member of Clubs or Organizations: No   Attends Music therapist: Never   Marital Status: Married    Tobacco Counseling Counseling given: Not Answered   Clinical Intake:  Pre-visit preparation completed: Yes  Pain : No/denies pain     Nutritional Risks: None Diabetes: No  How often do you need to have someone help you when you read instructions, pamphlets, or other written materials from your doctor or pharmacy?: 1 - Never What is the last grade level you completed in school?: [place]   Interpreter Needed?: No  Information entered by :: [place] of Daily Living    07/25/2021    3:39 PM  In your present state of health, do you have any difficulty performing the following activities:  Hearing? 0  Vision? 0  Difficulty concentrating or making decisions? 0  Walking or climbing stairs? 0  Dressing or bathing? 0  Doing errands, shopping? 0  Preparing Food and eating ? N  Using the Toilet? N  In the past six months, have you accidently leaked urine? N  Do you have problems with loss of bowel control? N  Managing your Medications? N  Managing your Finances? N  Housekeeping or managing your Housekeeping? N    Patient Care Team: Libby Maw, MD as PCP - General (Family Medicine)  Indicate any recent Medical Services you may have received from other than [facility] providers in the past year (date may be approximate).     Assessment:   This is a routine wellness examination for Evan Parker.  Hearing/Vision screen Vision Screening - Comments:: Annual  eye exams wear glasses   Dietary issues and exercise activities discussed: Current Exercise Habits: Home exercise routine, Type of exercise: walking, Time (Minutes): 50, Frequency (Times/Week): 5, Weekly Exercise (Minutes/Week): 250, Intensity: Mild, Exercise limited by: None identified   Goals Addressed   None    Depression Screen    07/25/2021    3:39 PM 07/25/2021    3:37 PM 01/11/2021    3:42 PM 01/11/2021    2:04 PM 11/04/2016   10:04 AM  PHQ 2/9 Scores  PHQ - 2 Score 0 0 0 0 0  PHQ- 9 Score   0      Fall Risk    07/25/2021    3:40 PM 01/11/2021    2:04 PM  Fall  Risk   Falls in the past year? 0 0  Number falls in past yr: 0 0  Injury with Fall? 0   Follow up Falls evaluation completed     [place]:  Any stairs in or around the home? No  If so, are there any without handrails? No  Home free of loose throw rugs in walkways, pet beds, electrical cords, etc? Yes  Adequate lighting in your home to reduce risk of falls? Yes   ASSISTIVE DEVICES UTILIZED TO PREVENT FALLS:  Life alert? No  Use of a cane, walker or w/c? No  Grab bars in the bathroom? No  Shower chair or bench in shower? No  Elevated toilet seat or a handicapped toilet? No    Cognitive Function:  Normal cognitive status assessed by telephone conversation  by this Nurse Health Advisor. No abnormalities found.        Immunizations Immunization History  Administered Date(s) Administered   Influenza, Seasonal, Injecte, Preservative Fre 02/05/2012   Influenza,inj,Quad PF,6+ Mos 11/22/2013   PNEUMOCOCCAL CONJUGATE-20 03/15/2021   Td 02/26/2000   Tdap 10/15/2010   Zoster Recombinat (Shingrix) 03/15/2021, 07/16/2021    TDAP status: Due, Education has been provided regarding the importance of this vaccine. Advised may receive this vaccine at local pharmacy or Health Dept. Aware to provide a copy of the vaccination record if obtained from local pharmacy or Health Dept.  Verbalized acceptance and understanding.  Flu Vaccine status: Declined, Education has been provided regarding the importance of this vaccine but patient still declined. Advised may receive this vaccine at local pharmacy or Health Dept. Aware to provide a copy of the vaccination record if obtained from local pharmacy or Health Dept. Verbalized acceptance and understanding.  Pneumococcal vaccine status: Up to date  Covid-19 vaccine status: Completed vaccines  Qualifies for Shingles Vaccine? Yes   Zostavax completed Yes   Shingrix Completed?: Yes  Screening Tests Health Maintenance  Topic Date Due   Hepatitis C Screening  Never done   TETANUS/TDAP  01/11/2022 (Originally 10/14/2020)   COLONOSCOPY (Pts 45-34yr Insurance coverage will need to be confirmed)  08/05/2022   Pneumonia Vaccine 70 Years old  Completed   Zoster Vaccines- Shingrix  Completed   HPV VACCINES  Aged Out   INFLUENZA VACCINE  Discontinued   COVID-19 Vaccine  Discontinued    Health Maintenance  Health Maintenance Due  Topic Date Due   Hepatitis C Screening  Never done    Colorectal cancer screening: Type of screening: Colonoscopy. Completed 08/04/2017. Repeat every 5 years  Lung Cancer Screening: (Low Dose CT Chest recommended if Age 70-80years, 30 pack-year currently smoking OR have quit w/in 15years.) does not qualify.   Lung Cancer Screening Referral: n/a  Additional Screening:  Hepatitis C Screening: does not qualify;   Vision Screening: Recommended annual ophthalmology exams for early detection of glaucoma and other disorders of the eye. Is the patient up to date with their annual eye exam?  Yes  Who is the provider or what is the name of the office in which the patient attends annual eye exams? Dr.Shipiro  If pt is not established with a provider, would they like to be referred to a provider to establish care? No .   Dental Screening: Recommended annual dental exams for proper oral  hygiene  Community Resource Referral / Chronic Care Management: CRR required this visit?  No   CCM required this visit?  No      Plan:  I have personally reviewed and noted the following in the patient's chart:   Medical and social history Use of alcohol, tobacco or illicit drugs  Current medications and supplements including opioid prescriptions. Patient is not currently taking opioid prescriptions. Functional ability and status Nutritional status Physical activity Advanced directives List of other physicians Hospitalizations, surgeries, and ER visits in previous 12 months Vitals Screenings to include cognitive, depression, and falls Referrals and appointments  In addition, I have reviewed and discussed with patient certain preventive protocols, quality metrics, and best practice recommendations. A written personalized care plan for preventive services as well as general preventive health recommendations were provided to patient.     Randel Pigg, LPN   7/40/8144   Nurse Notes: none

## 2021-12-11 DIAGNOSIS — H52203 Unspecified astigmatism, bilateral: Secondary | ICD-10-CM | POA: Diagnosis not present

## 2021-12-11 DIAGNOSIS — Z961 Presence of intraocular lens: Secondary | ICD-10-CM | POA: Diagnosis not present

## 2021-12-11 DIAGNOSIS — H04129 Dry eye syndrome of unspecified lacrimal gland: Secondary | ICD-10-CM | POA: Diagnosis not present

## 2021-12-11 DIAGNOSIS — H524 Presbyopia: Secondary | ICD-10-CM | POA: Diagnosis not present

## 2022-01-11 ENCOUNTER — Ambulatory Visit: Payer: Medicare HMO | Admitting: Family Medicine

## 2022-01-14 ENCOUNTER — Ambulatory Visit: Payer: Medicare HMO | Admitting: Family Medicine

## 2022-01-28 ENCOUNTER — Encounter: Payer: Self-pay | Admitting: Family Medicine

## 2022-01-28 ENCOUNTER — Ambulatory Visit (INDEPENDENT_AMBULATORY_CARE_PROVIDER_SITE_OTHER): Payer: Medicare HMO | Admitting: Family Medicine

## 2022-01-28 VITALS — BP 138/78 | HR 56 | Temp 98.1°F | Ht 71.0 in | Wt 190.0 lb

## 2022-01-28 DIAGNOSIS — Z0000 Encounter for general adult medical examination without abnormal findings: Secondary | ICD-10-CM

## 2022-01-28 DIAGNOSIS — K219 Gastro-esophageal reflux disease without esophagitis: Secondary | ICD-10-CM

## 2022-01-28 DIAGNOSIS — N529 Male erectile dysfunction, unspecified: Secondary | ICD-10-CM

## 2022-01-28 DIAGNOSIS — Z125 Encounter for screening for malignant neoplasm of prostate: Secondary | ICD-10-CM | POA: Diagnosis not present

## 2022-01-28 LAB — URINALYSIS
Bilirubin Urine: NEGATIVE
Hgb urine dipstick: NEGATIVE
Ketones, ur: NEGATIVE
Leukocytes,Ua: NEGATIVE
Nitrite: NEGATIVE
Specific Gravity, Urine: 1.015 (ref 1.000–1.030)
Total Protein, Urine: NEGATIVE
Urine Glucose: NEGATIVE
Urobilinogen, UA: 0.2 (ref 0.0–1.0)
pH: 6 (ref 5.0–8.0)

## 2022-01-28 LAB — COMPREHENSIVE METABOLIC PANEL
ALT: 18 U/L (ref 0–53)
AST: 14 U/L (ref 0–37)
Albumin: 4.1 g/dL (ref 3.5–5.2)
Alkaline Phosphatase: 60 U/L (ref 39–117)
BUN: 15 mg/dL (ref 6–23)
CO2: 30 mEq/L (ref 19–32)
Calcium: 9 mg/dL (ref 8.4–10.5)
Chloride: 104 mEq/L (ref 96–112)
Creatinine, Ser: 0.93 mg/dL (ref 0.40–1.50)
GFR: 83.47 mL/min (ref >60.00)
Glucose, Bld: 97 mg/dL (ref 70–99)
Potassium: 4 mEq/L (ref 3.5–5.1)
Sodium: 140 mEq/L (ref 135–145)
Total Bilirubin: 0.6 mg/dL (ref 0.2–1.2)
Total Protein: 6.6 g/dL (ref 6.0–8.3)

## 2022-01-28 LAB — LIPID PANEL
Cholesterol: 203 mg/dL — ABNORMAL HIGH (ref 0–200)
HDL: 73.6 mg/dL (ref >39.00)
LDL Cholesterol: 114 mg/dL — ABNORMAL HIGH (ref 0–99)
NonHDL: 129.45
Total CHOL/HDL Ratio: 3
Triglycerides: 75 mg/dL (ref 0.0–149.0)
VLDL: 15 mg/dL (ref 0.0–40.0)

## 2022-01-28 LAB — CBC WITH DIFFERENTIAL/PLATELET
Basophils Absolute: 0 10*3/uL (ref 0.0–0.1)
Basophils Relative: 1.1 % (ref 0.0–3.0)
Eosinophils Absolute: 0.2 10*3/uL (ref 0.0–0.7)
Eosinophils Relative: 5.5 % — ABNORMAL HIGH (ref 0.0–5.0)
HCT: 39.3 % (ref 39.0–52.0)
Hemoglobin: 13.9 g/dL (ref 13.0–17.0)
Lymphocytes Relative: 41.1 % (ref 12.0–46.0)
Lymphs Abs: 1.7 10*3/uL (ref 0.7–4.0)
MCHC: 35.4 g/dL (ref 30.0–36.0)
MCV: 92.8 fl (ref 78.0–100.0)
Monocytes Absolute: 0.4 10*3/uL (ref 0.1–1.0)
Monocytes Relative: 9.4 % (ref 3.0–12.0)
Neutro Abs: 1.8 10*3/uL (ref 1.4–7.7)
Neutrophils Relative %: 42.9 % — ABNORMAL LOW (ref 43.0–77.0)
Platelets: 260 10*3/uL (ref 150.0–400.0)
RBC: 4.24 Mil/uL (ref 4.22–5.81)
RDW: 13 % (ref 11.5–15.5)
WBC: 4.1 10*3/uL (ref 4.0–10.5)

## 2022-01-28 LAB — TSH: TSH: 2.43 u[IU]/mL (ref 0.35–5.50)

## 2022-01-28 LAB — PSA: PSA: 0.51 ng/mL (ref 0.10–4.00)

## 2022-01-28 MED ORDER — TADALAFIL 20 MG PO TABS: ORAL_TABLET | ORAL | 11 refills | Status: AC

## 2022-01-28 NOTE — Progress Notes (Signed)
Established Patient Office Visit   Subjective:  Patient ID: Evan Cuttino., male    DOB: Apr 17, 1950  Age: 71 y.o. MRN: [id number]  Chief Complaint  Patient presents with   Annual Exam    CPE, no concerns. Patient not fasting.     HPI Encounter Diagnoses  Name Primary?   Healthcare maintenance Yes   Gastroesophageal reflux disease, unspecified whether esophagitis present    ERECTILE DYSFUNCTION, ORGANIC    Here fasting for complete physical.  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  He is in search of regular dental care.  He will need implant work.  Continues tadalafil 20 mg as needed.  GERD currently controlled.   Review of Systems  Constitutional: Negative.   HENT: Negative.    Eyes:  Negative for blurred vision, discharge and redness.  Respiratory: Negative.    Cardiovascular: Negative.   Gastrointestinal:  Negative for abdominal pain.  Genitourinary: Negative.   Musculoskeletal: Negative.  Negative for myalgias.  Skin:  Negative for rash.  Neurological:  Negative for tingling, loss of consciousness and weakness.  Endo/Heme/Allergies:  Negative for polydipsia.      01/28/2022    8:45 AM 07/25/2021    3:39 PM 07/25/2021    3:37 PM  Depression screen PHQ 2/9  Decreased Interest 0 0 0  Down, Depressed, Hopeless 0 0 0  PHQ - 2 Score 0 0 0       Current Outpatient Medications:    albuterol (VENTOLIN HFA) 108 (90 Base) MCG/ACT inhaler, Inhale 2 puffs into the lungs every 6 (six) hours as needed., Disp: 1 Inhaler, Rfl: 2   cetirizine (ZYRTEC) 10 MG tablet, Take 10 mg by mouth daily.  , Disp: , Rfl:    psyllium (METAMUCIL SMOOTH TEXTURE) 58.6 % powder, Take 1 packet by mouth 3 (three) times daily., Disp: 283 g, Rfl: 12   triamcinolone (KENALOG) 0.025 % ointment, Apply twice daily to hands as needed for eczema., Disp: 80 g, Rfl: 0   tadalafil (CIALIS) 20 MG tablet, May take one q od as needed for ED., Disp: 10 tablet, Rfl: 11  Current  Facility-Administered Medications:    0.9 %  sodium chloride infusion, 500 mL, Intravenous, Once, Irene Shipper, MD   Objective:     BP 138/78 (BP Location: Right Arm, Patient Position: Sitting, Cuff Size: Normal)   Pulse (!) 56   Temp 98.1 F (36.7 C) (Temporal)   Ht '5\' 11"'$  (1.803 m)   Wt 190 lb (86.2 kg)   SpO2 97%   BMI 26.50 kg/m    Physical Exam Constitutional:      General: He is not in acute distress.    Appearance: Normal appearance. He is not ill-appearing, toxic-appearing or diaphoretic.  HENT:     Head: Normocephalic and atraumatic.     Right Ear: External ear normal.     Left Ear: External ear normal.     Mouth/Throat:     Mouth: Mucous membranes are moist.     Pharynx: Oropharynx is clear. No oropharyngeal exudate or posterior oropharyngeal erythema.  Eyes:     General: No scleral icterus.       Right eye: No discharge.        Left eye: No discharge.     Extraocular Movements: Extraocular movements intact.     Conjunctiva/sclera: Conjunctivae normal.     Pupils: Pupils are equal, round, and reactive to light.  Cardiovascular:  Rate and Rhythm: Normal rate and regular rhythm.  Pulmonary:     Effort: Pulmonary effort is normal. No respiratory distress.     Breath sounds: Normal breath sounds.  Abdominal:     General: Bowel sounds are normal. There is no distension.     Tenderness: There is no abdominal tenderness. There is no guarding.  Musculoskeletal:     Cervical back: No rigidity or tenderness.  Skin:    General: Skin is warm and dry.  Neurological:     Mental Status: He is alert and oriented to person, place, and time.  Psychiatric:        Mood and Affect: Mood normal.        Behavior: Behavior normal.      No results found for any visits on 01/28/22.    The 10-year ASCVD risk score (Arnett DK, et al., 2019) is: 17.3%    Assessment & Plan:   Healthcare maintenance -     Urinalysis -     PSA -     CBC with Differential/Platelet -      Lipid panel -     Comprehensive metabolic panel -     TSH  Gastroesophageal reflux disease, unspecified whether esophagitis present -     Comprehensive metabolic panel  ERECTILE DYSFUNCTION, ORGANIC -     Tadalafil; May take one q od as needed for ED.  Dispense: 10 tablet; Refill: 11    Return in about 1 year (around 01/29/2023), or if symptoms worsen or fail to improve.   Continue to tadalafil as needed for ED.  He is motivated to lose weight.  Discussed his elevated ASCVD risk score.  Offered him a statin.  He will consider.  Continue healthy and active lifestyle.  Libby Maw, MD

## 2022-06-25 ENCOUNTER — Encounter: Payer: Self-pay | Admitting: Internal Medicine

## 2022-07-18 ENCOUNTER — Telehealth: Payer: Self-pay | Admitting: Family Medicine

## 2022-07-18 NOTE — Telephone Encounter (Signed)
Appointment scheduled for evaluation.  

## 2022-07-18 NOTE — Telephone Encounter (Signed)
Pt has poison ivy on both arms and spreading around  his body . Pt want to know can he get medication or a shot for it

## 2022-07-19 ENCOUNTER — Encounter: Payer: Self-pay | Admitting: Family Medicine

## 2022-07-19 ENCOUNTER — Ambulatory Visit (INDEPENDENT_AMBULATORY_CARE_PROVIDER_SITE_OTHER): Payer: Medicare HMO | Admitting: Family Medicine

## 2022-07-19 VITALS — BP 118/72 | HR 59 | Temp 98.7°F | Ht 71.0 in | Wt 184.4 lb

## 2022-07-19 DIAGNOSIS — L237 Allergic contact dermatitis due to plants, except food: Secondary | ICD-10-CM

## 2022-07-19 MED ORDER — PREDNISONE 20 MG PO TABS: ORAL_TABLET | ORAL | 0 refills | Status: AC

## 2022-07-19 NOTE — Patient Instructions (Signed)
Poison Ivy Dermatitis Poison ivy dermatitis is irritation and swelling (inflammation) of the skin caused by chemicals in the leaves of the poison ivy plant. The skin reaction often involves redness, blisters, and extreme itching. What are the causes? This condition is caused by a chemical (urushiol) found in the sap of the poison ivy plant. This chemical is sticky and can easily spread to people, animals, and objects. You can get poison ivy dermatitis by: Having direct contact with a poison ivy plant. Touching animals, other people, or objects that have come in contact with poison ivy and have the chemical on them. What increases the risk? This condition is more likely to develop in people who: Are outdoors often in wooded or marshy areas. Go outdoors without wearing protective clothing, such as closed shoes, long pants, and a long-sleeved shirt. What are the signs or symptoms? Symptoms of this condition include: Redness of the skin. Extreme itching. A rash that often includes bumps and blisters. The rash usually appears 48 hours after exposure, if you have been exposed before. If this is the first time you have been exposed, the rash may not appear until a week after exposure. Swelling. This may occur if the reaction is more severe. Symptoms usually last for 1-2 weeks. However, the first time you develop this condition, symptoms may last 3-4 weeks. How is this diagnosed? This condition may be diagnosed based on your symptoms and a physical exam. Your health care provider may also ask you about any recent outdoor activity. How is this treated? Treatment for this condition will vary depending on how severe it is. Treatment may include: Hydrocortisone cream or calamine lotion to relieve itching. Oatmeal baths to soothe the skin. Medicines, such as over-the-counter antihistamine tablets. Oral or injected steroid medicine, for more severe reactions. Follow these instructions at  home: Medicines Take or apply over-the-counter and prescription medicines only as told by your health care provider. Use hydrocortisone cream or calamine lotion as needed to soothe the skin and relieve itching. General instructions Do not scratch or rub your skin. Apply a cold, wet cloth (cold compress) to the affected areas or take baths in cool water. This will help with itching. Avoid hot baths and showers. Take oatmeal baths as needed. Use colloidal oatmeal. You can get this at your local pharmacy or grocery store. Follow the instructions on the packaging. Wash all clothes, bedsheets, towels, and blankets you were in contact with between your exposure and appearance of the rash. Check the affected area every day for signs of infection. Check for: More redness, swelling, or pain. Fluid or blood. Warmth. Pus or a bad smell. Keep all follow-up visits. Your health care provider may want to see how your skin is progressing with treatment. How is this prevented?  Learn to identify the poison ivy plant and avoid contact with the plant. This plant can be recognized by the number of leaves. Generally, poison ivy has three leaves with flowering branches on a single stem. The leaves are typically glossy, and they have jagged edges that come to a point. If you have been exposed to poison ivy, thoroughly wash with soap and water right away. You have about 30 minutes to remove the plant resin before it will cause the rash. Be sure to wash under your fingernails, because any plant resin there will continue to spread the rash. When hiking or camping, wear clothes that will help you to avoid skin exposure. This includes long pants, a long-sleeved shirt, long socks,   and hiking boots. You can also apply preventive lotion to your skin to help limit exposure. If you suspect that your clothes or outdoor gear came in contact with poison ivy, rinse them off outside with a garden hose before you bring them inside  your house. When doing yard work or gardening, wear gloves, long sleeves, long pants, and boots. Wash your garden tools and gloves if they come in contact with poison ivy. If you suspect that your pet has come into contact with poison ivy, wash them with pet shampoo and water. Make sure to wear gloves while washing your pet. Contact a health care provider if: You have open sores in the rash area. You have any signs of infection. You have redness that spreads beyond the rash area. You have a fever. You have a rash over a large area of your body. You have a rash on your eyes, mouth, or genitals. You have a rash that does not improve after a few weeks. Get help right away if: Your face swells or your eyes swell shut. You have trouble breathing. You have trouble swallowing. These symptoms may be an emergency. Get help right away. Call 911. Do not wait to see if the symptoms will go away. Do not drive yourself to the hospital. This information is not intended to replace advice given to you by your health care provider. Make sure you discuss any questions you have with your health care provider. Document Revised: 07/12/2021 Document Reviewed: 07/12/2021 Elsevier Patient Education  2024 [employer]  

## 2022-07-19 NOTE — Progress Notes (Signed)
[facility] [address] [place] [address] Dept: [phone] Dept Fax: [phone]  Office Visit  Subjective:    Patient ID: Evan Starch., male    DOB: Sep 16, 1950, 72 y.o..   MRN: [id number]  Chief Complaint  Patient presents with   [facility] ivy on arms and chest x 3 days.  Has been using Calamine lotion.     History of Present Illness:  Patient is in today complaining of a 3-day history of an itchy rash on his arms. He notes he was clearing an area in his yard where there has been poison ivy in the past. He notes he was wearing gloves, but not long sleeves. He has a very pruritic rash on both forearms to up past the antecubital area. He has been using Calamine lotion, but this has not helped much.  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 1 Inhaler 2   cetirizine (ZYRTEC) 10 MG tablet Take 10 mg by mouth daily.       psyllium (METAMUCIL SMOOTH TEXTURE) 58.6 % powder Take 1 packet by mouth 3 (three) times daily. 283 g 12   tadalafil (CIALIS) 20 MG tablet May take one q od as needed for ED. 10 tablet 11   triamcinolone (KENALOG) 0.025 % ointment Apply twice daily to hands as needed for eczema. 80 g 0   Facility-Administered Medications Prior to Visit  Medication Dose Route Frequency Provider Last Rate Last Admin   0.9 %  sodium chloride infusion  500 mL Intravenous Once Hilarie Fredrickson, MD       No Known Allergies   Objective:   Today's Vitals   07/19/22 1432  BP: 118/72  Pulse: (!) 59  Temp: 98.7 F (37.1 C)  TempSrc: Temporal  SpO2: 99%  Weight: 184 lb 6.4 oz (83.6 kg)  Height: 5\' 11"  (1.803 m)   Body mass index is 25.72 kg/m.   General: Well developed, well nourished. No acute distress. Skin: Warm and dry. Diffuse, coalescing maculopapular lesions with erythema and crusting, some in straight lines.  Psych: Alert and oriented. Normal mood and affect.  Health Maintenance Due  Topic Date Due   Medicare Annual Wellness (AWV)  07/26/2022   Colonoscopy  08/05/2022  Assessment & Plan:   Problem List Items Addressed This Visit   None Visit Diagnoses     Poison ivy dermatitis    -  Primary   Will treat with a tapering course of prednisone. Recommend he wash clothes and dispose of gloves he used that day.   Relevant Medications   predniSONE (DELTASONE) 20 MG tablet       No follow-ups on file.   Loyola Mast, MD

## 2022-08-05 ENCOUNTER — Ambulatory Visit (INDEPENDENT_AMBULATORY_CARE_PROVIDER_SITE_OTHER): Payer: Medicare HMO

## 2022-08-05 VITALS — BP 118/62 | HR 53 | Temp 97.6°F | Ht 71.5 in | Wt 183.2 lb

## 2022-08-05 DIAGNOSIS — Z0000 Encounter for general adult medical examination without abnormal findings: Secondary | ICD-10-CM | POA: Diagnosis not present

## 2022-08-05 NOTE — Progress Notes (Signed)
Subjective:   Evan Parker. is a 72 y.o. male who presents for Medicare Annual/Subsequent preventive examination.  Review of Systems     Cardiac Risk Factors include: advanced age (>41men, >27 women);male gender     Objective:    Today's Vitals   08/05/22 1048  BP: 118/62  Pulse: (!) 53  Temp: 97.6 F (36.4 C)  TempSrc: Oral  SpO2: 96%  Weight: 183 lb 3.2 oz (83.1 kg)  Height: 5' 11.5" (1.816 m)   Body mass index is 25.2 kg/m.     08/05/2022   10:57 AM 07/25/2021    3:38 PM 08/04/2017   10:32 AM  Advanced Directives  Does Patient Have a Medical Advance Directive? No No No  Would patient like information on creating a medical advance directive?  No - Patient declined No - Patient declined    Current Medications (verified) Outpatient Encounter Medications as of 08/05/2022  Medication Sig   albuterol (VENTOLIN HFA) 108 (90 Base) MCG/ACT inhaler Inhale 2 puffs into the lungs every 6 (six) hours as needed.   cetirizine (ZYRTEC) 10 MG tablet Take 10 mg by mouth daily.     tadalafil (CIALIS) 20 MG tablet May take one q od as needed for ED.   psyllium (METAMUCIL SMOOTH TEXTURE) 58.6 % powder Take 1 packet by mouth 3 (three) times daily. (Patient not taking: Reported on 08/05/2022)   triamcinolone (KENALOG) 0.025 % ointment Apply twice daily to hands as needed for eczema. (Patient not taking: Reported on 08/05/2022)   Facility-Administered Encounter Medications as of 08/05/2022  Medication   0.9 %  sodium chloride infusion    Allergies (verified) Patient has no known allergies.   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   GERD 10/21/2006   Past Surgical History:  Procedure Laterality Date   COLONOSCOPY  2006   EYE SURGERY     foreign body R eye    SHOULDER SURGERY Right 01/2013   TONSILLECTOMY AND ADENOIDECTOMY     Family History  Problem Relation Age of Onset   Hyperlipidemia Other    Colon cancer Neg Hx    Esophageal cancer Neg Hx    Stomach cancer Neg Hx    Social History   Socioeconomic History   Marital status: Married    Spouse name: Not on file   Number of children: Not on file   Years of education: Not on file   Highest education level: Not on file  Occupational History   Not on file  Tobacco Use   Smoking status: Never   Smokeless tobacco: Former    Types: Associate Professor Use: Never used  Substance and Sexual Activity   Alcohol use: Yes    Alcohol/week: 7.0 standard drinks of alcohol    Types: 7 Cans of beer per week   Drug use: Not Currently   Sexual activity: Yes  Other Topics Concern   Not on file  Social History Narrative   Not on file   Social Determinants of Health   Financial Resource Strain: Low Risk  (08/05/2022)   Overall Financial Resource Strain (CARDIA)    Difficulty of Paying Living Expenses: Not hard at all  Food Insecurity: No Food Insecurity (08/05/2022)   Hunger Vital Sign  Worried About Programme researcher, broadcasting/film/video in the Last Year: Never true    Ran Out of Food in the Last Year: Never true  Transportation Needs: No Transportation Needs (08/05/2022)   PRAPARE - Administrator, Civil Service (Medical): No    Lack of Transportation (Non-Medical): No  Physical Activity: Inactive (08/05/2022)   Exercise Vital Sign    Days of Exercise per Week: 0 days    Minutes of Exercise per Session: 0 min  Stress: No Stress Concern Present (08/05/2022)   [employer] of Occupational Health - Occupational Stress Questionnaire    Feeling of Stress : Not at all  Social Connections: Moderately Isolated (07/25/2021)   Social Connection and Isolation Panel [NHANES]    Frequency of Communication with Friends and Family: Three times a week    Frequency of Social Gatherings with  Friends and Family: Three times a week    Attends Religious Services: Never    Active Member of Clubs or Organizations: No    Attends Engineer, structural: Never    Marital Status: Married    Tobacco Counseling Counseling given: Not Answered   Clinical Intake:  Pre-visit preparation completed: Yes  Pain : No/denies pain     Nutritional Status: BMI 25 -29 Overweight Nutritional Risks: None Diabetes: No  How often do you need to have someone help you when you read instructions, pamphlets, or other written materials from your doctor or pharmacy?: 1 - Never  Diabetic? no  Interpreter Needed?: No  Information entered by :: NAllen LPN   Activities of Daily Living    08/05/2022   10:58 AM  In your present state of health, do you have any difficulty performing the following activities:  Hearing? 1  Vision? 0  Difficulty concentrating or making decisions? 0  Walking or climbing stairs? 0  Dressing or bathing? 0  Doing errands, shopping? 0  Preparing Food and eating ? N  Using the Toilet? N  In the past six months, have you accidently leaked urine? N  Do you have problems with loss of bowel control? N  Managing your Medications? N  Managing your Finances? N  Housekeeping or managing your Housekeeping? N    Patient Care Team: Mliss Sax, MD as PCP - General (Family Medicine)  Indicate any recent Medical Services you may have received from other than [facility] providers in the past year (date may be approximate).     Assessment:   This is a routine wellness examination for Evan Parker.  Hearing/Vision screen Vision Screening - Comments:: Regular eye exams, Dr. Nile Riggs, [facility]  Dietary issues and exercise activities discussed: Current Exercise Habits: The patient has a physically strenuous job, but has no regular exercise apart from work.   Goals Addressed             This Visit's Progress    Patient Stated       08/05/2022, no goals        Depression Screen    08/05/2022   10:58 AM 07/19/2022    2:38 PM 01/28/2022    8:45 AM 07/25/2021    3:39 PM 07/25/2021    3:37 PM 01/11/2021    3:42 PM 01/11/2021    2:04 PM  PHQ 2/9 Scores  PHQ - 2 Score 0 0 0 0 0 0 0  PHQ- 9 Score      0     Fall Risk    08/05/2022   10:58 AM 07/19/2022  2:38 PM 01/28/2022    8:45 AM 07/25/2021    3:40 PM 01/11/2021    2:04 PM  Fall Risk   Falls in the past year? 0 0 0 0 0  Number falls in past yr: 0 0 0 0 0  Injury with Fall? 0 0 0 0   Risk for fall due to : Medication side effect No Fall Risks No Fall Risks    Follow up Falls prevention discussed;Education provided;Falls evaluation completed Falls evaluation completed  Falls evaluation completed     FALL RISK PREVENTION PERTAINING TO THE HOME:  Any stairs in or around the home? No  If so, are there any without handrails?  N/a Home free of loose throw rugs in walkways, pet beds, electrical cords, etc? Yes  Adequate lighting in your home to reduce risk of falls? Yes   ASSISTIVE DEVICES UTILIZED TO PREVENT FALLS:  Life alert? No  Use of a cane, walker or w/c? No  Grab bars in the bathroom? No  Shower chair or bench in shower? No  Elevated toilet seat or a handicapped toilet? Yes   TIMED UP AND GO:  Was the test performed? Yes .  Length of time to ambulate 10 feet: 5 sec.   Gait steady and fast with assistive device  Cognitive Function:        08/05/2022   11:01 AM  6CIT Screen  What Year? 0 points  What month? 0 points  What time? 0 points  Count back from 20 2 points  Months in reverse 0 points  Repeat phrase 0 points  Total Score 2 points    Immunizations Immunization History  Administered Date(s) Administered   Influenza, Seasonal, Injecte, Preservative Fre 02/05/2012   Influenza,inj,Quad PF,6+ Mos 11/22/2013   PNEUMOCOCCAL CONJUGATE-20 03/15/2021   Td 02/26/2000   Tdap 10/15/2010   Zoster Recombinat (Shingrix) 03/15/2021, 07/16/2021    TDAP status: Due,  Education has been provided regarding the importance of this vaccine. Advised may receive this vaccine at local pharmacy or Health Dept. Aware to provide a copy of the vaccination record if obtained from local pharmacy or Health Dept. Verbalized acceptance and understanding.  Flu Vaccine status: Declined, Education has been provided regarding the importance of this vaccine but patient still declined. Advised may receive this vaccine at local pharmacy or Health Dept. Aware to provide a copy of the vaccination record if obtained from local pharmacy or Health Dept. Verbalized acceptance and understanding.  Pneumococcal vaccine status: Up to date  Covid-19 vaccine status: Declined, Education has been provided regarding the importance of this vaccine but patient still declined. Advised may receive this vaccine at local pharmacy or Health Dept.or vaccine clinic. Aware to provide a copy of the vaccination record if obtained from local pharmacy or Health Dept. Verbalized acceptance and understanding.  Qualifies for Shingles Vaccine? Yes   Zostavax completed Yes   Shingrix Completed?: Yes  Screening Tests Health Maintenance  Topic Date Due   Colonoscopy  08/05/2022   Hepatitis C Screening  02/28/2023 (Originally 01/17/1969)   Medicare Annual Wellness (AWV)  08/05/2023   Pneumonia Vaccine 33+ Years old  Completed   Zoster Vaccines- Shingrix  Completed   HPV VACCINES  Aged Out   DTaP/Tdap/Td  Discontinued   INFLUENZA VACCINE  Discontinued   COVID-19 Vaccine  Discontinued    Health Maintenance  Health Maintenance Due  Topic Date Due   Colonoscopy  08/05/2022    Colorectal cancer screening: Type of screening: Colonoscopy. Completed 08/04/2017. Repeat  every 5 years  Lung Cancer Screening: (Low Dose CT Chest recommended if Age 55-80 years, 30 pack-year currently smoking OR have quit w/in 15years.) does not qualify.   Lung Cancer Screening Referral: no  Additional Screening:  Hepatitis C  Screening: does not qualify;   Vision Screening: Recommended annual ophthalmology exams for early detection of glaucoma and other disorders of the eye. Is the patient up to date with their annual eye exam?  Yes  Who is the provider or what is the name of the office in which the patient attends annual eye exams? Dr. Nile Riggs If pt is not established with a provider, would they like to be referred to a provider to establish care? No .   Dental Screening: Recommended annual dental exams for proper oral hygiene  Community Resource Referral / Chronic Care Management: CRR required this visit?  No   CCM required this visit?  No      Plan:     I have personally reviewed and noted the following in the patient's chart:   Medical and social history Use of alcohol, tobacco or illicit drugs  Current medications and supplements including opioid prescriptions. Patient is not currently taking opioid prescriptions. Functional ability and status Nutritional status Physical activity Advanced directives List of other physicians Hospitalizations, surgeries, and ER visits in previous 12 months Vitals Screenings to include cognitive, depression, and falls Referrals and appointments  In addition, I have reviewed and discussed with patient certain preventive protocols, quality metrics, and best practice recommendations. A written personalized care plan for preventive services as well as general preventive health recommendations were provided to patient.     Barb Merino, LPN   06/03/8117   Nurse Notes: none

## 2022-08-05 NOTE — Patient Instructions (Signed)
Evan Parker , Thank you for taking time to come for your Medicare Wellness Visit. I appreciate your ongoing commitment to your health goals. Please review the following plan we discussed and let me know if I can assist you in the future.   These are the goals we discussed:  Goals      Patient Stated     08/05/2022, no goals        This is a list of the screening recommended for you and due dates:  Health Maintenance  Topic Date Due   Colon Cancer Screening  08/05/2022   Hepatitis C Screening  02/28/2023*   Medicare Annual Wellness Visit  08/05/2023   Pneumonia Vaccine  Completed   Zoster (Shingles) Vaccine  Completed   HPV Vaccine  Aged Out   DTaP/Tdap/Td vaccine  Discontinued   Flu Shot  Discontinued   COVID-19 Vaccine  Discontinued  *Topic was postponed. The date shown is not the original due date.    Advanced directives: Advance directive discussed with you today. Even though you declined this today please call our office should you change your mind and we can give you the proper paperwork for you to fill out.  Conditions/risks identified: none  Next appointment: Follow up in one year for your annual wellness visit.   Preventive Care 81 Years and Older, Male  Preventive care refers to lifestyle choices and visits with your health care provider that can promote health and wellness. What does preventive care include? A yearly physical exam. This is also called an annual well check. Dental exams once or twice a year. Routine eye exams. Ask your health care provider how often you should have your eyes checked. Personal lifestyle choices, including: Daily care of your teeth and gums. Regular physical activity. Eating a healthy diet. Avoiding tobacco and drug use. Limiting alcohol use. Practicing safe sex. Taking low doses of aspirin every day. Taking vitamin and mineral supplements as recommended by your health care provider. What happens during an annual well check? The  services and screenings done by your health care provider during your annual well check will depend on your age, overall health, lifestyle risk factors, and family history of disease. Counseling  Your health care provider may ask you questions about your: Alcohol use. Tobacco use. Drug use. Emotional well-being. Home and relationship well-being. Sexual activity. Eating habits. History of falls. Memory and ability to understand (cognition). Work and work Astronomer. Screening  You may have the following tests or measurements: Height, weight, and BMI. Blood pressure. Lipid and cholesterol levels. These may be checked every 5 years, or more frequently if you are over 90 years old. Skin check. Lung cancer screening. You may have this screening every year starting at age 52 if you have a 30-pack-year history of smoking and currently smoke or have quit within the past 15 years. Fecal occult blood test (FOBT) of the stool. You may have this test every year starting at age 40. Flexible sigmoidoscopy or colonoscopy. You may have a sigmoidoscopy every 5 years or a colonoscopy every 10 years starting at age 35. Prostate cancer screening. Recommendations will vary depending on your family history and other risks. Hepatitis C blood test. Hepatitis B blood test. Sexually transmitted disease (STD) testing. Diabetes screening. This is done by checking your blood sugar (glucose) after you have not eaten for a while (fasting). You may have this done every 1-3 years. Abdominal aortic aneurysm (AAA) screening. You may need this if you are a  current or former smoker. Osteoporosis. You may be screened starting at age 24 if you are at high risk. Talk with your health care provider about your test results, treatment options, and if necessary, the need for more tests. Vaccines  Your health care provider may recommend certain vaccines, such as: Influenza vaccine. This is recommended every year. Tetanus,  diphtheria, and acellular pertussis (Tdap, Td) vaccine. You may need a Td booster every 10 years. Zoster vaccine. You may need this after age 34. Pneumococcal 13-valent conjugate (PCV13) vaccine. One dose is recommended after age 96. Pneumococcal polysaccharide (PPSV23) vaccine. One dose is recommended after age 37. Talk to your health care provider about which screenings and vaccines you need and how often you need them. This information is not intended to replace advice given to you by your health care provider. Make sure you discuss any questions you have with your health care provider. Document Released: 03/10/2015 Document Revised: 11/01/2015 Document Reviewed: 12/13/2014 Elsevier Interactive Patient Education  2017 [place] Prevention in the Home Falls can cause injuries. They can happen to people of all ages. There are many things you can do to make your home safe and to help prevent falls. What can I do on the outside of my home? Regularly fix the edges of walkways and driveways and fix any cracks. Remove anything that might make you trip as you walk through a door, such as a raised step or threshold. Trim any bushes or trees on the path to your home. Use bright outdoor lighting. Clear any walking paths of anything that might make someone trip, such as rocks or tools. Regularly check to see if handrails are loose or broken. Make sure that both sides of any steps have handrails. Any raised decks and porches should have guardrails on the edges. Have any leaves, snow, or ice cleared regularly. Use sand or salt on walking paths during winter. Clean up any spills in your garage right away. This includes oil or grease spills. What can I do in the bathroom? Use night lights. Install grab bars by the toilet and in the tub and shower. Do not use towel bars as grab bars. Use non-skid mats or decals in the tub or shower. If you need to sit down in the shower, use a plastic,  non-slip stool. Keep the floor dry. Clean up any water that spills on the floor as soon as it happens. Remove soap buildup in the tub or shower regularly. Attach bath mats securely with double-sided non-slip rug tape. Do not have throw rugs and other things on the floor that can make you trip. What can I do in the bedroom? Use night lights. Make sure that you have a light by your bed that is easy to reach. Do not use any sheets or blankets that are too big for your bed. They should not hang down onto the floor. Have a firm chair that has side arms. You can use this for support while you get dressed. Do not have throw rugs and other things on the floor that can make you trip. What can I do in the kitchen? Clean up any spills right away. Avoid walking on wet floors. Keep items that you use a lot in easy-to-reach places. If you need to reach something above you, use a strong step stool that has a grab bar. Keep electrical cords out of the way. Do not use floor polish or wax that makes floors slippery. If you must use  wax, use non-skid floor wax. Do not have throw rugs and other things on the floor that can make you trip. What can I do with my stairs? Do not leave any items on the stairs. Make sure that there are handrails on both sides of the stairs and use them. Fix handrails that are broken or loose. Make sure that handrails are as long as the stairways. Check any carpeting to make sure that it is firmly attached to the stairs. Fix any carpet that is loose or worn. Avoid having throw rugs at the top or bottom of the stairs. If you do have throw rugs, attach them to the floor with carpet tape. Make sure that you have a light switch at the top of the stairs and the bottom of the stairs. If you do not have them, ask someone to add them for you. What else can I do to help prevent falls? Wear shoes that: Do not have high heels. Have rubber bottoms. Are comfortable and fit you well. Are closed  at the toe. Do not wear sandals. If you use a stepladder: Make sure that it is fully opened. Do not climb a closed stepladder. Make sure that both sides of the stepladder are locked into place. Ask someone to hold it for you, if possible. Clearly mark and make sure that you can see: Any grab bars or handrails. First and last steps. Where the edge of each step is. Use tools that help you move around (mobility aids) if they are needed. These include: Canes. Walkers. Scooters. Crutches. Turn on the lights when you go into a dark area. Replace any light bulbs as soon as they burn out. Set up your furniture so you have a clear path. Avoid moving your furniture around. If any of your floors are uneven, fix them. If there are any pets around you, be aware of where they are. Review your medicines with your doctor. Some medicines can make you feel dizzy. This can increase your chance of falling. Ask your doctor what other things that you can do to help prevent falls. This information is not intended to replace advice given to you by your health care provider. Make sure you discuss any questions you have with your health care provider. Document Released: 12/08/2008 Document Revised: 07/20/2015 Document Reviewed: 03/18/2014 Elsevier Interactive Patient Education  2017 [employer].

## 2022-11-29 ENCOUNTER — Telehealth: Payer: Self-pay

## 2022-11-29 NOTE — Patient Instructions (Signed)
Visit Information  Thank you for taking time to visit with me today. Please don't hesitate to contact me if I can be of assistance to you.   Following are the goals we discussed today:   Goals Addressed             This Visit's Progress    COMPLETED: Care Coordination Activities-No follow up required       Care Coordination Interventions: Advised patient to Annual Wellness exam. Discussed [facility] services and support. Assessed SDOH. Advised to discuss with primary care physician if services needed in the future.          If you are experiencing a Mental Health or Behavioral Health Crisis or need someone to talk to, please call the Suicide and Crisis Lifeline: 988   Patient verbalizes understanding of instructions and care plan provided today and agrees to view in MyChart. Active MyChart status and patient understanding of how to access instructions and care plan via MyChart confirmed with patient.     The patient has been provided with contact information for the care management team and has been advised to call with any health related questions or concerns.   Bary Leriche, RN, MSN [facility], [facility] Management Community Coordinator Direct Dial: [phone]  Fax: [phone] Website: [email or web address]

## 2022-11-29 NOTE — Patient Outreach (Signed)
  Care Coordination   Initial Visit Note   11/29/2022 Name: Evan Parker. MRN: [id number] DOB: [birth year]  Evan Parker. is a 72 y.o. year old male who sees Mliss Sax, MD for primary care. I spoke with  Evan Parker. by phone today.  What matters to the patients health and wellness today?  none    Goals Addressed             This Visit's Progress    COMPLETED: Care Coordination Activities-No follow up required       Care Coordination Interventions: Advised patient to Annual Wellness exam. Discussed [facility] services and support. Assessed SDOH. Advised to discuss with primary care physician if services needed in the future.        SDOH assessments and interventions completed:  Yes  SDOH Interventions Today    Flowsheet Row Most Recent Value  SDOH Interventions   Food Insecurity Interventions Intervention Not Indicated  Utilities Interventions Intervention Not Indicated        Care Coordination Interventions:  Yes, provided   Follow up plan: No further intervention required.   Encounter Outcome:  Patient Visit Completed   Bary Leriche, RN, MSN [facility]  [facility], [facility] Management Community Coordinator Direct Dial: [phone]  Fax: [phone] Website: [email or web address]

## 2022-12-12 DIAGNOSIS — Z961 Presence of intraocular lens: Secondary | ICD-10-CM | POA: Diagnosis not present

## 2022-12-12 DIAGNOSIS — H26493 Other secondary cataract, bilateral: Secondary | ICD-10-CM | POA: Diagnosis not present

## 2023-02-10 DIAGNOSIS — L309 Dermatitis, unspecified: Secondary | ICD-10-CM | POA: Diagnosis not present

## 2023-03-07 ENCOUNTER — Ambulatory Visit (INDEPENDENT_AMBULATORY_CARE_PROVIDER_SITE_OTHER): Payer: Medicare HMO | Admitting: Family Medicine

## 2023-03-07 ENCOUNTER — Encounter: Payer: Self-pay | Admitting: Family Medicine

## 2023-03-07 ENCOUNTER — Telehealth: Payer: Self-pay

## 2023-03-07 VITALS — BP 124/74 | HR 52 | Temp 97.5°F | Ht 71.0 in | Wt 187.2 lb

## 2023-03-07 DIAGNOSIS — Z0000 Encounter for general adult medical examination without abnormal findings: Secondary | ICD-10-CM

## 2023-03-07 DIAGNOSIS — Z125 Encounter for screening for malignant neoplasm of prostate: Secondary | ICD-10-CM

## 2023-03-07 DIAGNOSIS — Z13220 Encounter for screening for lipoid disorders: Secondary | ICD-10-CM

## 2023-03-07 DIAGNOSIS — Z131 Encounter for screening for diabetes mellitus: Secondary | ICD-10-CM

## 2023-03-07 LAB — URINALYSIS, ROUTINE W REFLEX MICROSCOPIC
Bilirubin Urine: NEGATIVE
Hgb urine dipstick: NEGATIVE
Ketones, ur: NEGATIVE
Leukocytes,Ua: NEGATIVE
Nitrite: NEGATIVE
RBC / HPF: NONE SEEN (ref 0–?)
Specific Gravity, Urine: 1.025 (ref 1.000–1.030)
Total Protein, Urine: NEGATIVE
Urine Glucose: NEGATIVE
Urobilinogen, UA: 0.2 (ref 0.0–1.0)
WBC, UA: NONE SEEN (ref 0–?)
pH: 5.5 (ref 5.0–8.0)

## 2023-03-07 LAB — LIPID PANEL
Cholesterol: 194 mg/dL (ref 0–200)
HDL: 73.1 mg/dL (ref >39.00)
LDL Cholesterol: 113 mg/dL — ABNORMAL HIGH (ref 0–99)
NonHDL: 120.51
Total CHOL/HDL Ratio: 3
Triglycerides: 36 mg/dL (ref 0.0–149.0)
VLDL: 7.2 mg/dL (ref 0.0–40.0)

## 2023-03-07 LAB — CBC WITH DIFFERENTIAL/PLATELET
Basophils Absolute: 0 10*3/uL (ref 0.0–0.1)
Basophils Relative: 0.6 % (ref 0.0–3.0)
Eosinophils Absolute: 0.1 10*3/uL (ref 0.0–0.7)
Eosinophils Relative: 2.4 % (ref 0.0–5.0)
HCT: 40.8 % (ref 39.0–52.0)
Hemoglobin: 14 g/dL (ref 13.0–17.0)
Lymphocytes Relative: 56.5 % — ABNORMAL HIGH (ref 12.0–46.0)
Lymphs Abs: 2.1 10*3/uL (ref 0.7–4.0)
MCHC: 34.3 g/dL (ref 30.0–36.0)
MCV: 94.3 fL (ref 78.0–100.0)
Monocytes Absolute: 0.3 10*3/uL (ref 0.1–1.0)
Monocytes Relative: 8.5 % (ref 3.0–12.0)
Neutro Abs: 1.2 10*3/uL — ABNORMAL LOW (ref 1.4–7.7)
Neutrophils Relative %: 32 % — ABNORMAL LOW (ref 43.0–77.0)
Platelets: 275 10*3/uL (ref 150.0–400.0)
RBC: 4.33 Mil/uL (ref 4.22–5.81)
RDW: 13 % (ref 11.5–15.5)
WBC: 3.7 10*3/uL — ABNORMAL LOW (ref 4.0–10.5)

## 2023-03-07 LAB — COMPREHENSIVE METABOLIC PANEL
ALT: 22 U/L (ref 0–53)
AST: 23 U/L (ref 0–37)
Albumin: 4.2 g/dL (ref 3.5–5.2)
Alkaline Phosphatase: 62 U/L (ref 39–117)
BUN: 15 mg/dL (ref 6–23)
CO2: 30 meq/L (ref 19–32)
Calcium: 8.7 mg/dL (ref 8.4–10.5)
Chloride: 104 meq/L (ref 96–112)
Creatinine, Ser: 0.83 mg/dL (ref 0.40–1.50)
GFR: 88.29 mL/min (ref >60.00)
Glucose, Bld: 95 mg/dL (ref 70–99)
Potassium: 3.9 meq/L (ref 3.5–5.1)
Sodium: 139 meq/L (ref 135–145)
Total Bilirubin: 0.8 mg/dL (ref 0.2–1.2)
Total Protein: 6.8 g/dL (ref 6.0–8.3)

## 2023-03-07 LAB — HEMOGLOBIN A1C: Hgb A1c MFr Bld: 5.7 % (ref 4.6–6.5)

## 2023-03-07 LAB — PSA: PSA: 0.44 ng/mL (ref 0.10–4.00)

## 2023-03-07 NOTE — Patient Instructions (Signed)
 Visit Information  Thank you for taking time to visit with me today. Please don't hesitate to contact me if I can be of assistance to you.   Following are the goals we discussed today:   Goals Addressed             This Visit's Progress    COMPLETED: Care Coordination Activities-No follow up required       Care Coordination Interventions: Discussed [facility] services and support. Assessed SDOH. Advised to discuss with primary care physician if services needed in the future.           If you are experiencing a Mental Health or Behavioral Health Crisis or need someone to talk to, please call the Suicide and Crisis Lifeline: 988   Patient verbalizes understanding of instructions and care plan provided today and agrees to view in MyChart. Active MyChart status and patient understanding of how to access instructions and care plan via MyChart confirmed with patient.     The patient has been provided with contact information for the care management team and has been advised to call with any health related questions or concerns.   Bary Leriche, RN, MSN RN Care Manager [facility], Population Health Direct Dial: [phone]  Fax: [phone] Website: [email or web address]

## 2023-03-07 NOTE — Progress Notes (Addendum)
 Established Patient Office Visit   Subjective:  Patient ID: Evan Parker., male    DOB: [birth year]  Age: 73 y.o. MRN: [id number]  Chief Complaint  Patient presents with   Annual Exam    CPE: Pt is fasting.     HPI Encounter Diagnoses  Name Primary?   Healthcare maintenance Yes   Screening for diabetes mellitus    Screening for prostate cancer    Screening for cholesterol level    For yearly physical.  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  He is married and lives with his wife and granddaughter.  Granddaughter's mother is an addict and that stresses him a great deal.  He had lost his son to an overdose many years ago.  This situation definitely reflected his answers to the PHQ-9 today.  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  He does not have regular dental care.  He is quite active on his job   Review of Systems  Constitutional: Negative.   HENT: Negative.    Eyes:  Positive for blurred vision. Negative for discharge and redness.  Respiratory: Negative.    Cardiovascular: Negative.   Gastrointestinal:  Negative for abdominal pain.  Genitourinary: Negative.   Musculoskeletal: Negative.  Negative for myalgias.  Skin:  Negative for rash.  Neurological:  Negative for tingling, loss of consciousness and weakness.  Endo/Heme/Allergies:  Negative for polydipsia.     Current Outpatient Medications:    albuterol  (VENTOLIN  HFA) 108 (90 Base) MCG/ACT inhaler, Inhale 2 puffs into the lungs every 6 (six) hours as needed., Disp: 1 Inhaler, Rfl: 2   cetirizine (ZYRTEC) 10 MG tablet, Take 10 mg by mouth daily.  , Disp: , Rfl:    levocetirizine (XYZAL) 5 MG tablet, Take 5 mg by mouth every evening., Disp: , Rfl:    psyllium (METAMUCIL SMOOTH TEXTURE) 58.6 % powder, Take 1 packet by mouth 3 (three) times daily., Disp: 283 g, Rfl: 12   tadalafil  (CIALIS ) 20 MG tablet, May take one q od as needed for ED., Disp: 10 tablet, Rfl: 11    triamcinolone  (KENALOG ) 0.025 % ointment, Apply twice daily to hands as needed for eczema., Disp: 80 g, Rfl: 0  Current Facility-Administered Medications:    0.9 %  sodium chloride  infusion, 500 mL, Intravenous, Once, Abran Norleen SAILOR, MD   Objective:     BP 124/74   Pulse (!) 52   Temp (!) 97.5 F (36.4 C)   Ht 5' 11 (1.803 m)   Wt 187 lb 3.2 oz (84.9 kg)   SpO2 97%   BMI 26.11 kg/m  Wt Readings from Last 3 Encounters:  03/07/23 187 lb 3.2 oz (84.9 kg)  08/05/22 183 lb 3.2 oz (83.1 kg)  07/19/22 184 lb 6.4 oz (83.6 kg)      Physical Exam Constitutional:      General: He is not in acute distress.    Appearance: Normal appearance. He is not ill-appearing, toxic-appearing or diaphoretic.  HENT:     Head: Normocephalic and atraumatic.     Right Ear: External ear normal.     Left Ear: External ear normal.     Mouth/Throat:     Mouth: Mucous membranes are moist.     Pharynx: Oropharynx is clear. No oropharyngeal exudate or posterior oropharyngeal erythema.  Eyes:     General: No scleral icterus.       Right eye: No discharge.  Left eye: No discharge.     Extraocular Movements: Extraocular movements intact.     Conjunctiva/sclera: Conjunctivae normal.     Pupils: Pupils are equal, round, and reactive to light.  Cardiovascular:     Rate and Rhythm: Normal rate and regular rhythm.  Pulmonary:     Effort: Pulmonary effort is normal. No respiratory distress.     Breath sounds: Normal breath sounds.  Abdominal:     General: Bowel sounds are normal.     Tenderness: There is no abdominal tenderness. There is no guarding or rebound.  Musculoskeletal:     Cervical back: No rigidity or tenderness.  Lymphadenopathy:     Cervical: No cervical adenopathy.  Skin:    General: Skin is warm and dry.  Neurological:     Mental Status: He is alert and oriented to person, place, and time.  Psychiatric:        Mood and Affect: Mood normal.        Behavior: Behavior normal.       Results for orders placed or performed in visit on 03/07/23  CBC with Differential/Platelet  Result Value Ref Range   WBC 3.7 (L) 4.0 - 10.5 K/uL   RBC 4.33 4.22 - 5.81 Mil/uL   Hemoglobin 14.0 13.0 - 17.0 g/dL   HCT 59.1 60.9 - 47.9 %   MCV 94.3 78.0 - 100.0 fl   MCHC 34.3 30.0 - 36.0 g/dL   RDW 86.9 88.4 - 84.4 %   Platelets 275.0 150.0 - 400.0 K/uL   Neutrophils Relative % 32.0 (L) 43.0 - 77.0 %   Lymphocytes Relative 56.5 Repeated and verified X2. (H) 12.0 - 46.0 %   Monocytes Relative 8.5 3.0 - 12.0 %   Eosinophils Relative 2.4 0.0 - 5.0 %   Basophils Relative 0.6 0.0 - 3.0 %   Neutro Abs 1.2 (L) 1.4 - 7.7 K/uL   Lymphs Abs 2.1 0.7 - 4.0 K/uL   Monocytes Absolute 0.3 0.1 - 1.0 K/uL   Eosinophils Absolute 0.1 0.0 - 0.7 K/uL   Basophils Absolute 0.0 0.0 - 0.1 K/uL  Comprehensive metabolic panel  Result Value Ref Range   Sodium 139 135 - 145 mEq/L   Potassium 3.9 3.5 - 5.1 mEq/L   Chloride 104 96 - 112 mEq/L   CO2 30 19 - 32 mEq/L   Glucose, Bld 95 70 - 99 mg/dL   BUN 15 6 - 23 mg/dL   Creatinine, Ser 9.16 0.40 - 1.50 mg/dL   Total Bilirubin 0.8 0.2 - 1.2 mg/dL   Alkaline Phosphatase 62 39 - 117 U/L   AST 23 0 - 37 U/L   ALT 22 0 - 53 U/L   Total Protein 6.8 6.0 - 8.3 g/dL   Albumin 4.2 3.5 - 5.2 g/dL   GFR 11.70 >39.99 mL/min   Calcium 8.7 8.4 - 10.5 mg/dL  Lipid panel  Result Value Ref Range   Cholesterol 194 0 - 200 mg/dL   Triglycerides 63.9 0.0 - 149.0 mg/dL   HDL 26.89 >60.99 mg/dL   VLDL 7.2 0.0 - 59.9 mg/dL   LDL Cholesterol 886 (H) 0 - 99 mg/dL   Total CHOL/HDL Ratio 3    NonHDL 120.51   PSA  Result Value Ref Range   PSA 0.44 0.10 - 4.00 ng/mL  Urinalysis, Routine w reflex microscopic  Result Value Ref Range   Color, Urine YELLOW Yellow;Lt. Yellow;Straw;Dark Yellow;Amber;Green;Red;Brown   APPearance CLEAR Clear;Turbid;Slightly Cloudy;Cloudy   Specific Gravity, Urine 1.025 1.000 -  1.030   pH 5.5 5.0 - 8.0   Total Protein, Urine NEGATIVE  Negative   Urine Glucose NEGATIVE Negative   Ketones, ur NEGATIVE Negative   Bilirubin Urine NEGATIVE Negative   Hgb urine dipstick NEGATIVE Negative   Urobilinogen, UA 0.2 0.0 - 1.0   Leukocytes,Ua NEGATIVE Negative   Nitrite NEGATIVE Negative   WBC, UA none seen 0-2/hpf   RBC / HPF none seen 0-2/hpf   Squamous Epithelial / HPF Rare(0-4/hpf) Rare(0-4/hpf)   Bacteria, UA Many(>50/hpf) (A) None   Amorphous Present (A) None;Present  Hemoglobin A1c  Result Value Ref Range   Hgb A1c MFr Bld 5.7 4.6 - 6.5 %      The 10-year ASCVD risk score (Arnett DK, et al., 2019) is: 15.5%    Assessment & Plan:   Healthcare maintenance -     CBC with Differential/Platelet -     Urinalysis, Routine w reflex microscopic  Screening for diabetes mellitus -     Comprehensive metabolic panel -     Hemoglobin A1c  Screening for prostate cancer -     PSA  Screening for cholesterol level -     Comprehensive metabolic panel -     Lipid panel    Return in about 6 months (around 09/04/2023), or if symptoms worsen or fail to improve.  Information was given about health maintenance and disease prevention.  He will schedule prompt follow-up for OD issues with his eye doctor.  He will seek out dental care.  Continue active healthy lifestyle.  Elsie Sim Lent, MD

## 2023-03-07 NOTE — Patient Outreach (Signed)
  Care Coordination   In Person Provider Office Visit Note   03/07/2023 Name: Evan Parker. MRN: [id number] DOB: 1950/03/22  Evan Parker. is a 73 y.o. year old male who sees Berneta Elsie Sayre, MD for primary care. I engaged with Evan Parker. in the providers office today.  What matters to the patients health and wellness today?  none    Goals Addressed             This Visit's Progress    COMPLETED: Care Coordination Activities-No follow up required       Care Coordination Interventions: Discussed [facility] services and support. Assessed SDOH. Advised to discuss with primary care physician if services needed in the future.        SDOH assessments and interventions completed:  Yes  SDOH Interventions Today    Flowsheet Row Most Recent Value  SDOH Interventions   Health Literacy Interventions Intervention Not Indicated        Care Coordination Interventions:  Yes, provided   Follow up plan: No further intervention required.   Encounter Outcome:  Patient Visit Completed   Anaia Frith J Cire Clute, RN, MSN RN Care Manager [facility], Population Health Direct Dial: [phone]  Fax: [phone] Website: [email or web address]

## 2023-03-14 ENCOUNTER — Telehealth: Payer: Self-pay | Admitting: Family Medicine

## 2023-03-14 NOTE — Telephone Encounter (Signed)
 ERROR

## 2023-03-21 ENCOUNTER — Telehealth: Payer: Self-pay | Admitting: Family Medicine

## 2023-03-21 NOTE — Telephone Encounter (Signed)
error

## 2023-03-24 DIAGNOSIS — L309 Dermatitis, unspecified: Secondary | ICD-10-CM | POA: Diagnosis not present

## 2023-04-17 ENCOUNTER — Telehealth: Payer: Self-pay | Admitting: Family Medicine

## 2023-04-17 NOTE — Telephone Encounter (Signed)
error 

## 2023-06-23 ENCOUNTER — Ambulatory Visit: Admitting: Family Medicine

## 2023-08-07 ENCOUNTER — Ambulatory Visit: Payer: Medicare HMO

## 2023-09-05 ENCOUNTER — Ambulatory Visit: Payer: Medicare HMO | Admitting: Family Medicine

## 2023-09-26 ENCOUNTER — Encounter: Payer: Self-pay | Admitting: Family Medicine

## 2023-12-15 DIAGNOSIS — H26493 Other secondary cataract, bilateral: Secondary | ICD-10-CM | POA: Diagnosis not present

## 2023-12-15 DIAGNOSIS — H524 Presbyopia: Secondary | ICD-10-CM | POA: Diagnosis not present

## 2023-12-15 DIAGNOSIS — H5203 Hypermetropia, bilateral: Secondary | ICD-10-CM | POA: Diagnosis not present

## 2023-12-15 DIAGNOSIS — H52223 Regular astigmatism, bilateral: Secondary | ICD-10-CM | POA: Diagnosis not present

## 2023-12-15 DIAGNOSIS — Z961 Presence of intraocular lens: Secondary | ICD-10-CM | POA: Diagnosis not present

## 2023-12-16 ENCOUNTER — Telehealth: Payer: Self-pay

## 2023-12-16 NOTE — Telephone Encounter (Signed)
 Flu metric resumed and [facility] message sent for colonoscopy.

## 2024-01-02 ENCOUNTER — Ambulatory Visit

## 2024-01-07 DIAGNOSIS — H26493 Other secondary cataract, bilateral: Secondary | ICD-10-CM | POA: Diagnosis not present

## 2024-01-07 DIAGNOSIS — H40013 Open angle with borderline findings, low risk, bilateral: Secondary | ICD-10-CM | POA: Diagnosis not present

## 2024-01-07 DIAGNOSIS — Z961 Presence of intraocular lens: Secondary | ICD-10-CM | POA: Diagnosis not present

## 2024-01-21 DIAGNOSIS — H26492 Other secondary cataract, left eye: Secondary | ICD-10-CM | POA: Diagnosis not present

## 2024-02-04 DIAGNOSIS — Z9889 Other specified postprocedural states: Secondary | ICD-10-CM | POA: Diagnosis not present

## 2024-03-12 ENCOUNTER — Ambulatory Visit: Payer: Medicare HMO | Admitting: Family Medicine

## 2024-03-12 ENCOUNTER — Ambulatory Visit: Payer: Self-pay | Admitting: Family Medicine

## 2024-03-12 ENCOUNTER — Encounter: Payer: Self-pay | Admitting: Family Medicine

## 2024-03-12 VITALS — BP 130/70 | HR 58 | Temp 98.2°F | Ht 71.0 in | Wt 189.6 lb

## 2024-03-12 DIAGNOSIS — Z131 Encounter for screening for diabetes mellitus: Secondary | ICD-10-CM

## 2024-03-12 DIAGNOSIS — Z125 Encounter for screening for malignant neoplasm of prostate: Secondary | ICD-10-CM | POA: Diagnosis not present

## 2024-03-12 DIAGNOSIS — Z0000 Encounter for general adult medical examination without abnormal findings: Secondary | ICD-10-CM

## 2024-03-12 DIAGNOSIS — E559 Vitamin D deficiency, unspecified: Secondary | ICD-10-CM

## 2024-03-12 DIAGNOSIS — Z1211 Encounter for screening for malignant neoplasm of colon: Secondary | ICD-10-CM

## 2024-03-12 DIAGNOSIS — Z13220 Encounter for screening for lipoid disorders: Secondary | ICD-10-CM | POA: Diagnosis not present

## 2024-03-12 LAB — LIPID PANEL
Cholesterol: 214 mg/dL — ABNORMAL HIGH (ref 28–200)
HDL: 82.9 mg/dL
LDL Cholesterol: 123 mg/dL — ABNORMAL HIGH (ref 10–99)
NonHDL: 130.83
Total CHOL/HDL Ratio: 3
Triglycerides: 40 mg/dL (ref 10.0–149.0)
VLDL: 8 mg/dL (ref 0.0–40.0)

## 2024-03-12 LAB — COMPREHENSIVE METABOLIC PANEL WITH GFR
ALT: 17 U/L (ref 3–53)
AST: 18 U/L (ref 5–37)
Albumin: 4.1 g/dL (ref 3.5–5.2)
Alkaline Phosphatase: 55 U/L (ref 39–117)
BUN: 14 mg/dL (ref 6–23)
CO2: 29 meq/L (ref 19–32)
Calcium: 9.1 mg/dL (ref 8.4–10.5)
Chloride: 104 meq/L (ref 96–112)
Creatinine, Ser: 0.82 mg/dL (ref 0.40–1.50)
GFR: 87.98 mL/min
Glucose, Bld: 85 mg/dL (ref 70–99)
Potassium: 4.3 meq/L (ref 3.5–5.1)
Sodium: 140 meq/L (ref 135–145)
Total Bilirubin: 0.7 mg/dL (ref 0.2–1.2)
Total Protein: 6.9 g/dL (ref 6.0–8.3)

## 2024-03-12 LAB — URINALYSIS, ROUTINE W REFLEX MICROSCOPIC
Bilirubin Urine: NEGATIVE
Hgb urine dipstick: NEGATIVE
Ketones, ur: NEGATIVE
Leukocytes,Ua: NEGATIVE
Nitrite: NEGATIVE
RBC / HPF: NONE SEEN
Specific Gravity, Urine: 1.01 (ref 1.000–1.030)
Total Protein, Urine: NEGATIVE
Urine Glucose: NEGATIVE
Urobilinogen, UA: 0.2 (ref 0.0–1.0)
pH: 6 (ref 5.0–8.0)

## 2024-03-12 LAB — CBC
HCT: 39.4 % (ref 39.0–52.0)
Hemoglobin: 13.7 g/dL (ref 13.0–17.0)
MCHC: 34.7 g/dL (ref 30.0–36.0)
MCV: 92.8 fl (ref 78.0–100.0)
Platelets: 325 K/uL (ref 150.0–400.0)
RBC: 4.25 Mil/uL (ref 4.22–5.81)
RDW: 13 % (ref 11.5–15.5)
WBC: 4.4 K/uL (ref 4.0–10.5)

## 2024-03-12 LAB — PSA: PSA: 0.41 ng/mL (ref 0.10–4.00)

## 2024-03-12 LAB — VITAMIN D 25 HYDROXY (VIT D DEFICIENCY, FRACTURES): VITD: 48.11 ng/mL (ref 30.00–100.00)

## 2024-03-12 LAB — HEMOGLOBIN A1C: Hgb A1c MFr Bld: 5.6 % (ref 4.6–6.5)

## 2024-03-12 NOTE — Progress Notes (Signed)
 "  Established Patient Office Visit   Subjective:  Patient ID: Evan Thieme., male    DOB: [birth year]  Age: 74 y.o. MRN: [id number]  Chief Complaint  Patient presents with   Annual Exam    CPE/labs.  Fasting today.  No concerns.     HPI Encounter Diagnoses  Name Primary?   Healthcare maintenance Yes   Screening for diabetes mellitus    Screening for prostate cancer    Screening for cholesterol level    Vitamin D  deficiency    Screening for colon cancer    For physical and follow-up of above.  Continues to work at [employer].  He is currently raising his 37-year-old granddaughter.  Her father is living with him.  Mother is not involved.  She is in active in addiction.  He is active physically on his job.  History of mild asthma.  He has rarely using his inhaler, especially after their dog died.  Continues to seek dental care.  He has been taking a vitamin D  tablet and is not certain of the dosage.  He enjoys baking and making candy.   Review of Systems  Constitutional: Negative.   HENT: Negative.    Eyes:  Negative for blurred vision, discharge and redness.  Respiratory: Negative.    Cardiovascular: Negative.   Gastrointestinal:  Negative for abdominal pain.  Genitourinary: Negative.   Musculoskeletal: Negative.  Negative for myalgias.  Skin:  Negative for rash.  Neurological:  Negative for tingling, loss of consciousness and weakness.  Endo/Heme/Allergies:  Negative for polydipsia.      03/12/2024    8:01 AM 03/07/2023    8:52 AM 08/05/2022   10:58 AM  Depression screen PHQ 2/9  Decreased Interest 0 0 0  Down, Depressed, Hopeless 0 0 0  PHQ - 2 Score 0 0 0  Altered sleeping  3   Tired, decreased energy  3   Change in appetite  3   Feeling bad or failure about yourself   0   Trouble concentrating  2   Moving slowly or fidgety/restless  0   Suicidal thoughts  0   PHQ-9 Score  11    Difficult doing work/chores  Somewhat difficult      Data saved with a previous  flowsheet row definition     Current Medications[1]   Objective:     BP 130/70   Pulse (!) 58   Temp 98.2 F (36.8 C) (Temporal)   Ht 5' 11 (1.803 m)   Wt 189 lb 9.6 oz (86 kg)   SpO2 98%   BMI 26.44 kg/m    Physical Exam Constitutional:      General: He is not in acute distress.    Appearance: Normal appearance. He is not ill-appearing, toxic-appearing or diaphoretic.  HENT:     Head: Normocephalic and atraumatic.     Right Ear: Tympanic membrane, ear canal and external ear normal.     Left Ear: Tympanic membrane, ear canal and external ear normal.     Mouth/Throat:     Mouth: Mucous membranes are moist.     Pharynx: Oropharynx is clear. No oropharyngeal exudate or posterior oropharyngeal erythema.  Eyes:     General: No scleral icterus.       Right eye: No discharge.        Left eye: No discharge.     Extraocular Movements: Extraocular movements intact.     Conjunctiva/sclera: Conjunctivae normal.     Pupils: Pupils are  equal, round, and reactive to light.  Cardiovascular:     Rate and Rhythm: Normal rate and regular rhythm.  Pulmonary:     Effort: Pulmonary effort is normal. No respiratory distress.     Breath sounds: Normal breath sounds. No wheezing or rales.  Abdominal:     General: Bowel sounds are normal.     Tenderness: There is no abdominal tenderness. There is no guarding or rebound.  Musculoskeletal:     Cervical back: No rigidity or tenderness.  Skin:    General: Skin is warm and dry.  Neurological:     Mental Status: He is alert and oriented to person, place, and time.  Psychiatric:        Mood and Affect: Mood normal.        Behavior: Behavior normal.      No results found for any visits on 03/12/24.    The 10-year ASCVD risk score (Arnett DK, et al., 2019) is: 18%    Assessment & Plan:   Healthcare maintenance -     CBC -     Urinalysis, Routine w reflex microscopic  Screening for diabetes mellitus -     Comprehensive metabolic  panel with GFR -     Hemoglobin A1c  Screening for prostate cancer -     PSA  Screening for cholesterol level -     Comprehensive metabolic panel with GFR -     Lipid panel  Vitamin D  deficiency -     VITAMIN D  25 Hydroxy (Vit-D Deficiency, Fractures)  Screening for colon cancer -     Ambulatory referral to Gastroenterology    Return in about 1 year (around 03/12/2025), or if symptoms worsen or fail to improve.  Continue healthy active lifestyle.  Information was given on health maintenance and disease prevention.  Information was given on preventing type 2 diabetes.  Evan Sim Lent, MD    [1]  Current Outpatient Medications:    albuterol  (VENTOLIN  HFA) 108 (90 Base) MCG/ACT inhaler, Inhale 2 puffs into the lungs every 6 (six) hours as needed., Disp: 1 Inhaler, Rfl: 2   cetirizine (ZYRTEC) 10 MG tablet, Take 10 mg by mouth daily.  , Disp: , Rfl:    Cholecalciferol (VITAMIN D3) 10 MCG (400 UNIT) tablet, Take 400 Units by mouth daily., Disp: , Rfl:    levocetirizine (XYZAL) 5 MG tablet, Take 5 mg by mouth every evening., Disp: , Rfl:    tadalafil  (CIALIS ) 20 MG tablet, May take one q od as needed for ED., Disp: 10 tablet, Rfl: 11   triamcinolone  (KENALOG ) 0.025 % ointment, Apply twice daily to hands as needed for eczema., Disp: 80 g, Rfl: 0   psyllium (METAMUCIL SMOOTH TEXTURE) 58.6 % powder, Take 1 packet by mouth 3 (three) times daily. (Patient not taking: Reported on 03/12/2024), Disp: 283 g, Rfl: 12  Current Facility-Administered Medications:    0.9 %  sodium chloride  infusion, 500 mL, Intravenous, Once, Abran Norleen SAILOR, MD  "
